# Patient Record
Sex: Male | Born: 1985
Health system: Southern US, Community
[De-identification: ages and names within clinical notes are randomized; demographics above are authoritative.]

## PROBLEM LIST (undated history)

## (undated) DIAGNOSIS — F1210 Cannabis abuse, uncomplicated: Secondary | ICD-10-CM

## (undated) DIAGNOSIS — I219 Acute myocardial infarction, unspecified: Secondary | ICD-10-CM

## (undated) DIAGNOSIS — Z720 Tobacco use: Secondary | ICD-10-CM

## (undated) HISTORY — DX: Tobacco use: Z72.0

## (undated) HISTORY — PX: APPENDECTOMY: SHX54

## (undated) HISTORY — DX: Acute myocardial infarction, unspecified: I21.9

## (undated) HISTORY — DX: Cannabis abuse, uncomplicated: F12.10

---
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
## 2008-11-28 ENCOUNTER — Emergency Department (HOSPITAL_COMMUNITY): Admission: EM | Admit: 2008-11-28 | Discharge: 2008-11-28 | Payer: Self-pay | Admitting: Family Medicine

## 2008-11-28 ENCOUNTER — Emergency Department (HOSPITAL_COMMUNITY): Admission: EM | Admit: 2008-11-28 | Discharge: 2008-11-28 | Payer: Self-pay | Admitting: Emergency Medicine

## 2008-12-01 ENCOUNTER — Emergency Department (HOSPITAL_COMMUNITY): Admission: EM | Admit: 2008-12-01 | Discharge: 2008-12-01 | Payer: Self-pay | Admitting: Family Medicine

## 2008-12-22 ENCOUNTER — Emergency Department (HOSPITAL_COMMUNITY): Admission: EM | Admit: 2008-12-22 | Discharge: 2008-12-22 | Payer: Self-pay | Admitting: Emergency Medicine

## 2010-01-17 ENCOUNTER — Emergency Department (HOSPITAL_COMMUNITY): Admission: EM | Admit: 2010-01-17 | Discharge: 2010-01-17 | Payer: Self-pay | Admitting: Emergency Medicine

## 2010-04-18 ENCOUNTER — Emergency Department (HOSPITAL_COMMUNITY): Admission: EM | Admit: 2010-04-18 | Discharge: 2010-04-18 | Payer: Self-pay | Admitting: Family Medicine

## 2010-06-01 ENCOUNTER — Emergency Department (HOSPITAL_COMMUNITY): Admission: EM | Admit: 2010-06-01 | Discharge: 2010-06-01 | Payer: Self-pay | Admitting: Emergency Medicine

## 2010-07-02 ENCOUNTER — Emergency Department (HOSPITAL_COMMUNITY)
Admission: EM | Admit: 2010-07-02 | Discharge: 2010-07-02 | Disposition: A | Discharge status: Other Acute Inpatient Hospital | Payer: Self-pay | Source: Home / Self Care | Admitting: Family Medicine

## 2010-07-02 ENCOUNTER — Inpatient Hospital Stay (HOSPITAL_COMMUNITY)
Admission: EM | Admit: 2010-07-02 | Discharge: 2010-07-06 | Payer: Self-pay | Source: Home / Self Care | Attending: Cardiology | Admitting: Cardiology

## 2010-07-02 ENCOUNTER — Encounter: Payer: Self-pay | Admitting: Cardiology

## 2010-07-14 ENCOUNTER — Encounter: Payer: Self-pay | Admitting: Cardiology

## 2010-07-22 ENCOUNTER — Ambulatory Visit
Admission: RE | Admit: 2010-07-22 | Discharge: 2010-07-22 | Payer: Self-pay | Source: Home / Self Care | Attending: Cardiology | Admitting: Cardiology

## 2010-07-22 ENCOUNTER — Encounter: Payer: Self-pay | Admitting: Cardiology

## 2010-07-22 DIAGNOSIS — F17200 Nicotine dependence, unspecified, uncomplicated: Secondary | ICD-10-CM | POA: Insufficient documentation

## 2010-07-22 DIAGNOSIS — I214 Non-ST elevation (NSTEMI) myocardial infarction: Secondary | ICD-10-CM | POA: Insufficient documentation

## 2010-07-22 DIAGNOSIS — F411 Generalized anxiety disorder: Secondary | ICD-10-CM | POA: Insufficient documentation

## 2010-07-22 DIAGNOSIS — F1210 Cannabis abuse, uncomplicated: Secondary | ICD-10-CM | POA: Insufficient documentation

## 2010-07-22 LAB — CONVERTED CEMR LAB: LDL Goal: 160 mg/dL

## 2010-08-10 NOTE — Discharge Summary (Signed)
  NAME:  HOLMES, Aumiller                   ACCOUNT NO.:  [id number]  MEDICAL RECORD NO.:  [id number]          PATIENT TYPE:  INP  LOCATION:  2008                         FACILITY:  [facility]  PHYSICIAN:  Noralyn Pick. Eden Emms, MD, FACCDATE OF BIRTH:  Jul 06, 1986  DATE OF ADMISSION:  07/02/2010 DATE OF DISCHARGE:  07/06/2010                              DISCHARGE SUMMARY   ADDENDUM.  Of note, in the absence of obstructive coronary disease with high suspicion for presumed coronary vasospasm, the patient was not discharged on beta-blocker therapy and instead was placed on calcium channel blocker and long-acting nitrate.     Nicolasa Ducking, ANP   ______________________________ Noralyn Pick. Eden Emms, MD, [facility]    CB/MEDQ  D:  07/24/2010  T:  07/25/2010  Job:  [id number]  Electronically Signed by Nicolasa Ducking ANP on 08/10/2010 03:34:56 PM Electronically Signed by Charlton Haws MD [facility] on 08/10/2010 05:25:54 PM

## 2010-08-20 NOTE — Miscellaneous (Signed)
Summary: [facility] Cardiac Physician Order/Treatment Plan   [facility] Physician Order/Treatment Plan   Imported By: Roderic Ovens 07/21/2010 11:52:12  _____________________________________________________________________  External Attachment:    Type:   Image     Comment:   External Document

## 2010-08-20 NOTE — Assessment & Plan Note (Signed)
Summary: eph   Visit Type:  Follow-up Primary Provider:  Kirby Funk, MD  CC:  NQWMI.  History of Present Illness: The patient presents for followup of a recent non-Q wave myocardial infarction. He presented with chest discomfort, enzyme elevations and a very mildly abnormal EKG. However, did cardiac catheterization demonstrated normal coronaries with perhaps a small area of lateral wall motion abnormality. While in the hospital he had recurrent chest discomfort with transient pronounced ST elevation in lateral leads. However, followup CT angiography demonstrated normal coronaries with a zero calcium score. Echocardiography demonstrated a very mild hypokinesis of the distal lateral wall 10 EF of about 55%. MRI suggested the EF to be 52% and did not have any enhancement suggestive of pericarditis or myocarditis. Subsequently he was thought to have coronary spasm and was treated with nitrates and calcium channel blockers. Since going home he said one episode of chest discomfort not as severe as the presenting complaint. He took 2 nitroglycerin and his symptoms resolved. He unfortunately continues to cigarettes though he is bound to a couple a day. He is surrounded by cigarettes at work at home. He smokes marijuana and uses quite a bit of caffeine. He says he's had decreased exercise tolerance. He feels like when he tries to walk that his heart is pounding. He doesn't describe resting tachycardia palpitations. Is not describing PND or orthopnea. He says he does get some increased dyspnea with exertion however. He also alludes to the fact that he is somewhat anxious or depressed though he does not report any suicidal ideation.  Lipid Management History:      Positive NCEP/ATP III risk factors include ASHD (either angina/prior MI/prior CABG).  Negative NCEP/ATP III risk factors include male age less than 33 years old.     Current Medications (verified): 1)  Amlodipine Besylate 2.5 Mg Tabs (Amlodipine  Besylate) .Marland Kitchen.. 1 By Mouth Daily 2)  Aspirin 81 Mg  Tabs (Aspirin) .Marland Kitchen.. 1 By Mouth Daily 3)  Isosorbide Mononitrate Cr 30 Mg Xr24h-Tab (Isosorbide Mononitrate) .Marland Kitchen.. 1 By Mouth Daily 4)  Nitrostat 0.4 Mg Subl (Nitroglycerin) .... As Needed 5)  Protonix 40 Mg Tbec (Pantoprazole Sodium) .Marland Kitchen.. 1 By Mouth Daily  Allergies (verified): No Known Drug Allergies  Past History:  Past Medical History:  1. Non ST-segment elevation myocardial infarction.   2. Ongoing tobacco abuse, currently 1 pack a day.   3. Marijuana abuse, smoking daily.   4. Status post appendectomy at age 82.      Review of Systems       As stated in the HPI and negative for all other systems.   Vital Signs:  Patient profile:   25 year old male Height:      71 inches Weight:      219 pounds BMI:     30.65 Pulse rate:   82 / minute Resp:     16 per minute BP sitting:   108 / 80  (right arm)  Vitals Entered By: Marrion Coy, CNA (July 22, 2010 3:33 PM)  Physical Exam  General:  Well developed, well nourished, in no acute distress. Head:  normocephalic and atraumatic Mouth:  Teeth, gums and palate normal. Oral mucosa normal. Neck:  Neck supple, no JVD. No masses, thyromegaly or abnormal cervical nodes. Chest Wall:  no deformities or breast masses noted Lungs:  Clear bilaterally to auscultation and percussion. Abdomen:  Bowel sounds positive; abdomen soft and non-tender without masses, organomegaly, or hernias noted. No hepatosplenomegaly. Msk:  Back normal,  normal gait. Muscle strength and tone normal. Extremities:  No clubbing or cyanosis. Neurologic:  Alert and oriented x 3. Skin:  Intact without lesions or rashes. Cervical Nodes:  no significant adenopathy Inguinal Nodes:  no significant adenopathy Psych:  Normal affect.   Detailed Cardiovascular Exam  Neck    Carotids: Carotids full and equal bilaterally without bruits.      Neck Veins: Normal, no JVD.    Heart    Inspection: no deformities or  lifts noted.      Palpation: normal PMI with no thrills palpable.      Auscultation: regular rate and rhythm, S1, S2 without murmurs, rubs, gallops, or clicks.    Vascular    Abdominal Aorta: no palpable masses, pulsations, or audible bruits.      Femoral Pulses: normal femoral pulses bilaterally.      Pedal Pulses: normal pedal pulses bilaterally.      Radial Pulses: normal radial pulses bilaterally.      Peripheral Circulation: no clubbing, cyanosis, or edema noted with normal capillary refill.     EKG  Procedure date:  07/22/2010  Findings:      Sinus rhythm, rate 63, axis within normal limits, sinus arrhythmia, no acute ST-T wave changes  Impression & Recommendations:  Problem # 1:  MYOCARDIAL INFARCTION, ACUTE, NON-Q WAVE (ICD-410.70) The patient apparently had coronary vasospasm. He's had no recent chest discomfort similar to this. He should continue on meds as listed. Further testing is not indicated at this point. We discussed at length the need to stop substance abuse such as caffeine, tobacco and marijuana. He couldn't afford the patches. Try to stay with him cigarettes on his own but he says this is hard to secondary smoke he gets. Hein to the emergency room with any future acute symptoms not easily amenable to treatment with nitroglycerin Orders: EKG w/ Interpretation (93000)  Problem # 2:  ANXIETY STATE, UNSPECIFIED (ICD-300.00) The patient alluded to anxiety and depression. I discussed with him the importance of followup on this and encouraged him to make an appointment with his primary physician.  Problem # 3:  TOBACCO ABUSE (ICD-305.1) As above we discussed the need to stop smoking.  Lipid Assessment/Plan:      Based on NCEP/ATP III, the patient's risk factor category is "history of coronary disease, peripheral vascular disease, cerebrovascular disease, or aortic aneurysm".  The patient's lipid goals are as follows: Total cholesterol goal is 200; LDL cholesterol goal  is 160; HDL cholesterol goal is 40; Triglyceride goal is 150.    Patient Instructions: 1)  Your physician recommends that you schedule a follow-up appointment in: 2 month with Dr Antoine Poche 2)  Your physician recommends that you continue on your current medications as directed. Please refer to the Current Medication list given to you today.

## 2010-09-14 ENCOUNTER — Inpatient Hospital Stay (INDEPENDENT_AMBULATORY_CARE_PROVIDER_SITE_OTHER)
Admission: RE | Admit: 2010-09-14 | Discharge: 2010-09-14 | Disposition: A | Discharge status: Home / Self Care | Payer: 59 | Source: Ambulatory Visit | Attending: Family Medicine | Admitting: Family Medicine

## 2010-09-14 DIAGNOSIS — K5289 Other specified noninfective gastroenteritis and colitis: Secondary | ICD-10-CM

## 2010-09-17 ENCOUNTER — Ambulatory Visit: Payer: Self-pay | Admitting: Cardiology

## 2010-09-21 ENCOUNTER — Ambulatory Visit: Payer: Self-pay | Admitting: Cardiology

## 2010-09-28 LAB — HIGH SENSITIVITY CRP: CRP, High Sensitivity: 16.4 mg/L — ABNORMAL HIGH

## 2010-09-28 LAB — LIPID PANEL
HDL: 35 mg/dL — ABNORMAL LOW (ref >39)
Total CHOL/HDL Ratio: 3.2 RATIO
Triglycerides: 87 mg/dL (ref <150)
VLDL: 17 mg/dL (ref 0–40)

## 2010-09-28 LAB — CARDIAC PANEL(CRET KIN+CKTOT+MB+TROPI)
CK, MB: 66.6 ng/mL (ref 0.3–4.0)
CK, MB: 75.4 ng/mL (ref 0.3–4.0)
Relative Index: 8.8 — ABNORMAL HIGH (ref 0.0–2.5)
Total CK: 753 U/L — ABNORMAL HIGH (ref 7–232)
Total CK: 788 U/L — ABNORMAL HIGH (ref 7–232)
Troponin I: 15.98 ng/mL (ref 0.00–0.06)

## 2010-09-28 LAB — CBC
HCT: 38.1 % — ABNORMAL LOW (ref 39.0–52.0)
HCT: 40.3 % (ref 39.0–52.0)
Hemoglobin: 13.4 g/dL (ref 13.0–17.0)
Hemoglobin: 14.2 g/dL (ref 13.0–17.0)
Hemoglobin: 14.5 g/dL (ref 13.0–17.0)
MCH: 30.1 pg (ref 26.0–34.0)
MCH: 30.8 pg (ref 26.0–34.0)
MCH: 31 pg (ref 26.0–34.0)
MCHC: 35.2 g/dL (ref 30.0–36.0)
MCHC: 36 g/dL (ref 30.0–36.0)
MCHC: 36.8 g/dL — ABNORMAL HIGH (ref 30.0–36.0)
MCV: 85.6 fL (ref 78.0–100.0)
MCV: 86.2 fL (ref 78.0–100.0)
Platelets: 142 10*3/uL — ABNORMAL LOW (ref 150–400)
RBC: 4.45 MIL/uL (ref 4.22–5.81)
RDW: 12.5 % (ref 11.5–15.5)
RDW: 12.6 % (ref 11.5–15.5)
RDW: 12.7 % (ref 11.5–15.5)
WBC: 10 10*3/uL (ref 4.0–10.5)

## 2010-09-28 LAB — DIFFERENTIAL
Eosinophils Relative: 3 % (ref 0–5)
Monocytes Absolute: 0.8 10*3/uL (ref 0.1–1.0)
Monocytes Relative: 8 % (ref 3–12)

## 2010-09-28 LAB — RAPID URINE DRUG SCREEN, HOSP PERFORMED
Amphetamines: NOT DETECTED
Cocaine: NOT DETECTED
Opiates: NOT DETECTED
Tetrahydrocannabinol: POSITIVE — AB

## 2010-09-28 LAB — COMPREHENSIVE METABOLIC PANEL
ALT: 37 U/L (ref 0–53)
Chloride: 107 mEq/L (ref 96–112)
GFR calc non Af Amer: >60 mL/min (ref >60)
Glucose, Bld: 105 mg/dL — ABNORMAL HIGH (ref 70–99)
Potassium: 4.3 mEq/L (ref 3.5–5.1)
Total Protein: 6.2 g/dL (ref 6.0–8.3)

## 2010-09-28 LAB — BASIC METABOLIC PANEL
BUN: 13 mg/dL (ref 6–23)
BUN: 14 mg/dL (ref 6–23)
BUN: 8 mg/dL (ref 6–23)
CO2: 25 mEq/L (ref 19–32)
CO2: 27 mEq/L (ref 19–32)
CO2: 28 mEq/L (ref 19–32)
Calcium: 9.1 mg/dL (ref 8.4–10.5)
Chloride: 107 mEq/L (ref 96–112)
Creatinine, Ser: 0.89 mg/dL (ref 0.4–1.5)
GFR calc non Af Amer: >60 mL/min (ref >60)
GFR calc non Af Amer: >60 mL/min (ref >60)
GFR calc non Af Amer: >60 mL/min (ref >60)
Glucose, Bld: 104 mg/dL — ABNORMAL HIGH (ref 70–99)
Glucose, Bld: 110 mg/dL — ABNORMAL HIGH (ref 70–99)
Glucose, Bld: 94 mg/dL (ref 70–99)
Potassium: 3.8 mEq/L (ref 3.5–5.1)
Potassium: 4.2 mEq/L (ref 3.5–5.1)
Sodium: 137 mEq/L (ref 135–145)
Sodium: 137 mEq/L (ref 135–145)
Sodium: 138 mEq/L (ref 135–145)

## 2010-09-28 LAB — SEDIMENTATION RATE: Sed Rate: 7 mm/hr (ref 0–16)

## 2010-09-28 LAB — MRSA PCR SCREENING: MRSA by PCR: NEGATIVE

## 2010-09-28 LAB — POCT CARDIAC MARKERS: Myoglobin, poc: 198 ng/mL (ref 12–200)

## 2010-09-28 LAB — HEMOGLOBIN A1C: Hgb A1c MFr Bld: 4.9 % (ref <5.7)

## 2010-09-28 LAB — PROTIME-INR: INR: 1.14 (ref 0.00–1.49)

## 2010-10-01 LAB — HERPES SIMPLEX VIRUS CULTURE: Culture: NOT DETECTED

## 2010-10-01 LAB — WOUND CULTURE: Culture: NO GROWTH

## 2010-10-27 LAB — COMPREHENSIVE METABOLIC PANEL
ALT: 31 U/L (ref 0–53)
AST: 49 U/L — ABNORMAL HIGH (ref 0–37)
Alkaline Phosphatase: 23 U/L — ABNORMAL LOW (ref 39–117)
CO2: 26 mEq/L (ref 19–32)
Chloride: 104 mEq/L (ref 96–112)
GFR calc Af Amer: >60 mL/min (ref >60)
GFR calc non Af Amer: >60 mL/min (ref >60)
Glucose, Bld: 98 mg/dL (ref 70–99)
Sodium: 137 mEq/L (ref 135–145)
Total Bilirubin: 0.7 mg/dL (ref 0.3–1.2)

## 2010-10-27 LAB — CBC
Hemoglobin: 15 g/dL (ref 13.0–17.0)
RBC: 4.8 MIL/uL (ref 4.22–5.81)
WBC: 11.2 10*3/uL — ABNORMAL HIGH (ref 4.0–10.5)

## 2010-10-27 LAB — DIFFERENTIAL
Basophils Absolute: 0 10*3/uL (ref 0.0–0.1)
Basophils Relative: 0 % (ref 0–1)
Eosinophils Absolute: 0.2 10*3/uL (ref 0.0–0.7)
Eosinophils Relative: 2 % (ref 0–5)

## 2010-10-27 LAB — URINALYSIS, ROUTINE W REFLEX MICROSCOPIC
Bilirubin Urine: NEGATIVE
Glucose, UA: NEGATIVE mg/dL
Nitrite: NEGATIVE
Specific Gravity, Urine: 1.046 — ABNORMAL HIGH (ref 1.005–1.030)
pH: 6 (ref 5.0–8.0)

## 2010-10-27 LAB — RAPID URINE DRUG SCREEN, HOSP PERFORMED
Opiates: NOT DETECTED
Tetrahydrocannabinol: NOT DETECTED

## 2010-10-30 ENCOUNTER — Encounter: Payer: Self-pay | Admitting: Cardiology

## 2010-10-31 ENCOUNTER — Encounter: Payer: Self-pay | Admitting: Cardiology

## 2010-11-03 ENCOUNTER — Encounter: Payer: Self-pay | Admitting: Cardiology

## 2010-11-03 ENCOUNTER — Ambulatory Visit (INDEPENDENT_AMBULATORY_CARE_PROVIDER_SITE_OTHER): Payer: 59 | Admitting: Cardiology

## 2010-11-03 DIAGNOSIS — I214 Non-ST elevation (NSTEMI) myocardial infarction: Secondary | ICD-10-CM

## 2010-11-03 DIAGNOSIS — F17200 Nicotine dependence, unspecified, uncomplicated: Secondary | ICD-10-CM

## 2010-11-03 NOTE — Assessment & Plan Note (Signed)
He understands the implications of his lifestyle choices that include continued tobacco marijuana and alcohol. He understands the risks associated with this. He has been counseled and educated.

## 2010-11-03 NOTE — Progress Notes (Signed)
HPI The patient presents for followup after a hospitalization late last year with a non-Q-wave myocardial infarction. He had catheterization, echo and MRI. The presumptive diagnosis was possible vasospasm. He was discharged on nitrates and calcium channel blockers. However, he stopped this because a chemistry professor told him he should not be taking things. He has had no further symptoms. He says he is very active at work. He does not bring on any chest discomfort, neck or arm discomfort. He does not have any shortness of breath, PND or orthopnea. He has no palpitations, presyncope or syncope. He has had no weight gain or edema. He unfortunately continues to smoke marijuana and cigarettes and drink alcohol by his report.  No Known Allergies  Current Outpatient Prescriptions  Medication Sig Dispense Refill  . nitroGLYCERIN (NITROSTAT) 0.4 MG SL tablet Place 0.4 mg under the tongue every 5 (five) minutes as needed.        Marland Kitchen VYVANSE 30 MG capsule       . DISCONTD: amLODipine (NORVASC) 2.5 MG tablet Take 2.5 mg by mouth daily.        Marland Kitchen DISCONTD: aspirin 81 MG tablet Take 81 mg by mouth daily.        Marland Kitchen DISCONTD: INTUNIV 1 MG TB24       . DISCONTD: isosorbide mononitrate (IMDUR) 30 MG 24 hr tablet Take 30 mg by mouth daily.        Marland Kitchen DISCONTD: ondansetron (ZOFRAN-ODT) 4 MG disintegrating tablet       . DISCONTD: pantoprazole (PROTONIX) 40 MG tablet Take 40 mg by mouth daily.          Past Medical History  Diagnosis Date  . MI (myocardial infarction)   . Marijuana abuse   . Tobacco abuse     Past Surgical History  Procedure Date  . Appendectomy     Age 25   ROS:  As stated in the HPI and negative for all other systems.  PHYSICAL EXAM BP 139/92  Pulse 87  Ht 5\' 11"  (1.803 m)  Wt 204 lb (92.534 kg)  BMI 28.45 kg/m2 GENERAL:  Well appearing NECK:  No jugular venous distention, waveform within normal limits, carotid upstroke brisk and symmetric, no bruits, no thyromegaly LUNGS:  Clear to  auscultation bilaterally BACK:  No CVA tenderness CHEST:  Unremarkable HEART:  PMI not displaced or sustained,S1 and S2 within normal limits, no S3, no S4, no clicks, no rubs, no murmurs ABD:  Flat, positive bowel sounds normal in frequency in pitch, no bruits, no rebound, no guarding, no midline pulsatile mass, no hepatomegaly, no splenomegaly EXT:  2 plus pulses throughout, no edema, no cyanosis no clubbing SKIN:  No rashes no nodules NEURO:  Cranial nerves II through XII grossly intact, motor grossly intact throughout PSYCH:  Cognitively intact, oriented to person place and time, flat affect  EKG: Sinus rhythm, rate 87, axis within normal limits, intervals within normal limits, no acute ST-T wave changes.  ASSESSMENT AND PLAN

## 2010-11-03 NOTE — Assessment & Plan Note (Signed)
I reviewed his hospitalization records. We discussed the small possibility of myocarditis as an etiology versus mass. I did describe to him the risk of recurrent coronary spasm particularly as he has taken himself off of his beta blocker and nitrates. However, he does not want to take medications. He does agree to take nitroglycerin if needed and presented to the emergency room if he ever has further symptoms. He should participate much more aggressively in primary risk reduction and we have talked about this today and a previous encounters.

## 2010-11-03 NOTE — Patient Instructions (Signed)
Follow up as needed only.

## 2010-12-04 NOTE — H&P (Signed)
NAME:  Joel Sandoval                   ACCOUNT NO.:  [id number]   MEDICAL RECORD NO.:  [id number]          PATIENT TYPE:  INP   LOCATION:  0101                         FACILITY:  [facility]   PHYSICIAN:  Anselm Pancoast. Weatherly, M.D.DATE OF BIRTH:  February 25, 1986   DATE OF ADMISSION:  11/14/2004  DATE OF DISCHARGE:                                HISTORY & PHYSICAL   CHIEF COMPLAINT:  Abdominal pain.   HISTORY:  Joel Sandoval is a 25 year old, about 185 pounds male, senior or  junior in high school who presented to the ER after he was seen by urgent  care by Dr. Cleta Alberts.  They called me thinking the patient had acute  appendicitis.  The patient was fine yesterday.  This morning awoke a little  bloated and kind of minimal upset stomach.  Went to work.  He works in [employer], Merchandiser, retail, but came home after about an hour because of pain  that was increasing and evidently shifted to the lower right abdomen.  His  mother took him to urgent care.  He was seen by Dr. Cleta Alberts who did a white  count.  It was 15,500 and on exam he was definitely tender at [place] point  area and I was called thinking this was acute appendicitis.  Recommended I  see him in the emergency room and I was in agreement with the clinical  diagnosis and he was definitely locally tender and not with any flu-like  type symptoms.  I gave him 3 grams of Unasyn and permission obtained for an  open appendectomy.   REVIEW OF SYSTEMS:  No allergies.  No chronic medications.  He has had no  previous surgeries.  Later I find that his grandfather, I had operated on  about 4-5 years ago.   PHYSICAL EXAMINATION:  VITAL SIGNS:  He weighs 186 pounds.  His temperature  was 98.8 here.  He was 100.3 at urgent care.  Pulse is 95.  Blood pressure  is 131/77.  EYES/EARS/NOSE/THROAT:  Negative.  LUNGS:  Clear.  Good breath sounds.  ABDOMEN:  A few bowel sounds but on abdominal exam he is definitely tender  at McBurney point area with muscle guarding and  rebound.  RECTAL:  I did not do a rectal exam __________ at urgent care.  EXTREMITIES:  No pedal edema.  Good peripheral pulses.   ADMISSION IMPRESSION:  Acute appendicitis.   PLAN:  Open appendectomy, general anesthesia, 3 grams of Unasyn will be  administered.      WJW/MEDQ  D:  11/14/2004  T:  11/14/2004  Job:  [id number]

## 2010-12-04 NOTE — Op Note (Signed)
NAME:  Joel Sandoval, Joel Sandoval                   ACCOUNT NO.:  [id number]   MEDICAL RECORD NO.:  [id number]          PATIENT TYPE:  INP   LOCATION:  0101                         F[facility]   PHYSICIAN:  Anselm Pancoast. Weatherly, M.D.DATE OF BIRTH:  [birth year]   DATE OF PROCEDURE:  DATE OF DISCHARGE:                                 OPERATIVE REPORT   PREOPERATIVE DIAGNOSIS:  Acute appendicitis.   HISTORY:  Joel Sandoval is a 25 year old, about 185-190 pounds, Junior, who  presented to the urgent care today with the following history.  He said that  he felt fine yesterday but when he awoke this morning, he was a little  nauseous, kind of upper abdomen did not feel good.  He works at [employer], and he went to work but felt bad.  He did not actually throw up but  came home and then because of the increase in pain which was not shifted to  the right side, kind of lower abdomen, went to the urgent care.  He was seen  by Dr. Cleta Alberts who did a white count that was 15,500 and on exam he was  definitely tender in the right lower quadrant.  Urinalysis was negative.  Dr. Cleta Alberts thought that he probably had acute appendicitis.  He called me and  suggested I see the patient in the ER and I was in agreement.  He was  definitely tender at McBurney point, pretty well localized, temperature of  100.3, white count was 15,500 with a left shift, and I thought that this was  obviously clinically acute appendicitis and recommended proceeding with  appendectomy without CT.  The patient was in agreement and was taken to the  OR, after he was given 3 grams of Unasyn and permission obtained.   OPERATION:  Open appendectomy.   ANESTHESIA:  General.   SURGEON:  Anselm Pancoast. Zachery Dakins, M.D.   Endotracheal tube.  Abdomen was shaved in the right lower quadrant, and then  prepped with Betadine scrub solution and draped in a sterile manner.  In the  incision area I elected to make kind of transverse incision because of his  chubbiness.  Sharp segments of the skin and subcutaneous tissue and external  oblique was splint in the direction of its fibers.  The underlying internal  oblique was splint in the direction of its fibers exposing the lateral edge  of the rectus and then the peritoneum was identified under the  transversalis.  I carefully picked up the peritoneum, open into the  peritoneal cavity there was a moderate amount of fluid but it did not look  frankly grossly infected.  The two appendiceal tracts were used.  I could  run my finger laterally and the appendix was located right at the most  lateral aspect of the cecum.  It was acutely inflamed and I could grasp it  and pull it up into the wound.  The cecum itself was difficult to kind of  mobilize up into the incision and we divided the appendiceal mesentery  between right angles and Kelly's, and these  ligated with 2-0 Vicryl down to  the base of the inflamed appendix.  The appendix itself was crushed at its  base, a double tie of 2-0 Vicryl then a purse string of 3-0 silk placed.  The appendix removed and stump everted.  I put a second a second Z-stitch  for closure.  Good hemostasis had been obtained and the cecum was in its  normal position.  The omentum was coming over to the area and then we closed  the peritoneum and transversalis. with a running 2-0 Vicryl.  The internal  oblique was closed using interrupted 2-0 Vicryl.  Then the external oblique  was closed with a running 2-0 Vicryl.  The appearance of Scarpa's was  accomplished with interrupted closed with 4-0 Vicryl, 4-0 Vicryl  subcuticular and there are only Steri-Strips on the skin.  The patient  tolerated the procedure nicely and was sent to the recovery room in stable  post-op condition.  We will give him  liquids and hopefully he will be ready for discharge tomorrow.  He is quite  muscular and hopefully will not have too much as far as nausea or vomiting  since it was not much  intestinal manipulation but a lot of pulling by the  scrub nurse for exposure.      WJW/MEDQ  D:  11/14/2004  T:  11/14/2004  Job:  [id number]

## 2010-12-04 NOTE — H&P (Signed)
NAME:  Joel Sandoval, Joel Sandoval                   ACCOUNT NO.:  [id number]   MEDICAL RECORD NO.:  [id number]          PATIENT TYPE:  INP   LOCATION:  0101                         FACILITY:  [facility]   PHYSICIAN:  Anselm Pancoast. Weatherly, M.D.DATE OF BIRTH:  [birth year]   DATE OF ADMISSION:  11/14/2004  DATE OF DISCHARGE:                                HISTORY & PHYSICAL   Audio too short to transcribe (less than 5 seconds)      WJW/MEDQ  D:  11/14/2004  T:  11/14/2004  Job:  [id number]

## 2011-02-26 ENCOUNTER — Emergency Department (HOSPITAL_COMMUNITY): Payer: 59

## 2011-02-26 ENCOUNTER — Emergency Department (HOSPITAL_COMMUNITY)
Admission: EM | Admit: 2011-02-26 | Discharge: 2011-02-26 | Disposition: A | Discharge status: Home / Self Care | Payer: 59 | Attending: Emergency Medicine | Admitting: Emergency Medicine

## 2011-02-26 DIAGNOSIS — F17200 Nicotine dependence, unspecified, uncomplicated: Secondary | ICD-10-CM | POA: Insufficient documentation

## 2011-02-26 DIAGNOSIS — I252 Old myocardial infarction: Secondary | ICD-10-CM | POA: Insufficient documentation

## 2011-02-26 DIAGNOSIS — F1210 Cannabis abuse, uncomplicated: Secondary | ICD-10-CM | POA: Insufficient documentation

## 2011-02-26 DIAGNOSIS — R079 Chest pain, unspecified: Secondary | ICD-10-CM

## 2011-02-26 DIAGNOSIS — R0789 Other chest pain: Secondary | ICD-10-CM | POA: Insufficient documentation

## 2011-02-26 LAB — POCT I-STAT TROPONIN I
Troponin i, poc: 0 ng/mL (ref 0.00–0.08)
Troponin i, poc: 0 ng/mL (ref 0.00–0.08)
Troponin i, poc: 0 ng/mL (ref 0.00–0.08)

## 2011-02-26 LAB — COMPREHENSIVE METABOLIC PANEL
Albumin: 4.5 g/dL (ref 3.5–5.2)
BUN: 13 mg/dL (ref 6–23)
Calcium: 9.5 mg/dL (ref 8.4–10.5)
Chloride: 99 mEq/L (ref 96–112)
Creatinine, Ser: 0.95 mg/dL (ref 0.50–1.35)
Total Bilirubin: 0.5 mg/dL (ref 0.3–1.2)

## 2011-02-26 LAB — DIFFERENTIAL
Basophils Absolute: 0.1 10*3/uL (ref 0.0–0.1)
Basophils Relative: 0 % (ref 0–1)
Eosinophils Relative: 4 % (ref 0–5)
Monocytes Absolute: 0.7 10*3/uL (ref 0.1–1.0)
Neutro Abs: 5.3 10*3/uL (ref 1.7–7.7)

## 2011-02-26 LAB — CBC
Hemoglobin: 15.9 g/dL (ref 13.0–17.0)
MCHC: 36.7 g/dL — ABNORMAL HIGH (ref 30.0–36.0)
RDW: 12.6 % (ref 11.5–15.5)

## 2011-02-26 LAB — CK TOTAL AND CKMB (NOT AT ARMC)
CK, MB: 1.8 ng/mL (ref 0.3–4.0)
Total CK: 69 U/L (ref 7–232)

## 2011-03-02 NOTE — Consult Note (Signed)
NAME:  Joel Sandoval, Joel Sandoval                   ACCOUNT NO.:  [id number]  MEDICAL RECORD NO.:  [id number]  LOCATION:  WLED                         FACILITY:  [facility]  PHYSICIAN:  Veverly Fells. Excell Seltzer, MD  DATE OF BIRTH:  1985-10-25  DATE OF CONSULTATION: DATE OF DISCHARGE:  02/26/2011                                CONSULTATION   CHIEF COMPLAINT:  Chest pain.  HISTORY OF PRESENT ILLNESS:  Joel Sandoval is a 25 year old gentleman who presents with substernal chest pain.  He woke up this morning and after awakening, he developed a sudden onset of left-sided substernal chest discomfort.  The symptom onset was abrupt and he described severe pain. There were no associated symptoms.  He specifically denied dyspnea, nausea, vomiting, or diaphoresis.  He had similar symptoms back in December 2011, when he was noted to have an acute myocardial infarction. At the time of his cardiac catheterization, there was no significant coronary disease noted.  His EKG; however, was consistent with an inferior MI and coronary vasospasm was the presumed mechanism.  The patient had an extensive evaluation including an echocardiogram, cardiac catheterization, CT angiogram of the chest, and cardiac MRI.  There were no significant abnormalities on any of these studies with the cardiac MRI showing no hyperenhancement or scar tissue.  The patient's ejection fraction was normal by all studies ranging from 52% on cardiac MRI to 60% on cardiac cath.  He was discharged on a combination of aspirin, Norvasc, and Imdur.  He has been off of all these medications now for several months.  He unfortunately continues to smoke cigarettes and marijuana on a daily basis.  He currently is chest pain free and has had no residual problems since his pain has resolved this morning.  He described the pain is both a pressure-like sensation and a sharp pain.  It was relieved with 3 sublingual nitroglycerin.  CURRENT MEDICATIONS:  None.  ALLERGIES:   NKDA.  PAST MEDICAL HISTORY: 1. Coronary vasospasm as outlined above. 2. Tobacco abuse. 3. Marijuana abuse. 4. Alcohol abuse. 5. Remote appendectomy.  SOCIAL HISTORY:  The patient is single.  He lives locally in [place]. 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  FAMILY HISTORY:  Mother had a pacemaker placed at age 77.  Father is alive and well.  There is no premature coronary disease in the family.  REVIEW OF SYSTEMS:  Negative except as per HPI.  PHYSICAL EXAMINATION:  GENERAL:  The patient is alert and oriented.  He is in no distress. VITAL SIGNS:  His heart rate 72 beats per minute, blood pressure is 128/78, respiratory rate 16, oxygen saturation 100% on 2 liters.  He is age appropriate. HEENT:  Normal. NECK:  Normal carotid upstrokes.  No bruits.  JVP normal. LUNGS: Clear bilaterally. HEART: Regular rate and rhythm.  The apex is discrete, nondisplaced. There are no murmurs or gallops. ABDOMEN: Soft and nontender.  No organomegaly. BACK:  No CVA tenderness. EXTREMITIES:  No clubbing, cyanosis, or edema.  Peripheral pulses are intact and equal. SKIN:  Warm and dry without rash. NEUROLOGIC:  Cranial nerves II-XII are intact.  Strength is intact and equal bilaterally.  LAB WORK:  Shows  serial troponin's are all normal with most recent troponin 0.0.  CK-MB are also normal 68 and 1.7.  CBC shows a white blood cell count of 11.2, hemoglobin 15.9, and platelet count 187,000. Potassium is 3.6 and creatinine is 0.95.  LFTs are within normal limits.  EKG shows normal sinus rhythm and is within normal limits.  The heart rate was 72 beats per minute.  Chest x-ray shows no acute cardiopulmonary findings.  FINAL ASSESSMENT: 1. Chest pain, probable transient coronary vasospasm.  The patient's     symptoms are similar to his previous presentation,  albeit this     episode is more transient in nature.  His serial cardiac markers     are negative and his EKG shows no abnormality.  I do not  think he     needs further workup at this time.  He just had a cardiac     catheterization within the past year that showed normal findings.     I have recommended that he resume aspirin 81 mg daily.  He also     will be started on long-acting diltiazem CD 120 mg daily.  The     patient will be followed closely in the office with a followup     appointment with Tereso Newcomer within the next 2 weeks and ongoing     followup with Dr. Antoine Poche. 2. Tobacco and marijuana abuse.  I had a long discussion with the     patient and he understands that this is an important part of the     mechanism of coronary vasospasm.  I advised him that he will     continue to have recurrent problems if he continues to smoke and he     understands this.  Extensive counseling was done.  With no objective evidence of ischemia and the patient now chest pain free, I think he can be safely discharged home with followup as outlined above.  Again, he will start on aspirin 81 mg daily and diltiazem CD 120 mg daily.     Veverly Fells. Excell Seltzer, MD     MDC/MEDQ  D:  02/26/2011  T:  02/27/2011  Job:  [id number]  cc:   Rollene Rotunda, MD, [facility] [address]  [address] [place] [address]  Tereso Newcomer, PA-C  Electronically Signed by Tonny Bollman MD on 03/02/2011 05:27:07 AM

## 2011-08-25 ENCOUNTER — Emergency Department (HOSPITAL_COMMUNITY)
Admission: EM | Admit: 2011-08-25 | Discharge: 2011-08-25 | Disposition: A | Discharge status: Home / Self Care | Payer: 59 | Attending: Emergency Medicine | Admitting: Emergency Medicine

## 2011-08-25 ENCOUNTER — Encounter (HOSPITAL_COMMUNITY): Payer: Self-pay | Admitting: Emergency Medicine

## 2011-08-25 DIAGNOSIS — IMO0002 Reserved for concepts with insufficient information to code with codable children: Secondary | ICD-10-CM | POA: Insufficient documentation

## 2011-08-25 DIAGNOSIS — S058X9A Other injuries of unspecified eye and orbit, initial encounter: Secondary | ICD-10-CM | POA: Insufficient documentation

## 2011-08-25 DIAGNOSIS — F17200 Nicotine dependence, unspecified, uncomplicated: Secondary | ICD-10-CM | POA: Insufficient documentation

## 2011-08-25 DIAGNOSIS — I252 Old myocardial infarction: Secondary | ICD-10-CM | POA: Insufficient documentation

## 2011-08-25 DIAGNOSIS — Y93B9 Activity, other involving muscle strengthening exercises: Secondary | ICD-10-CM | POA: Insufficient documentation

## 2011-08-25 DIAGNOSIS — H11419 Vascular abnormalities of conjunctiva, unspecified eye: Secondary | ICD-10-CM | POA: Insufficient documentation

## 2011-08-25 DIAGNOSIS — H538 Other visual disturbances: Secondary | ICD-10-CM | POA: Insufficient documentation

## 2011-08-25 DIAGNOSIS — Z79899 Other long term (current) drug therapy: Secondary | ICD-10-CM | POA: Insufficient documentation

## 2011-08-25 DIAGNOSIS — S0501XA Injury of conjunctiva and corneal abrasion without foreign body, right eye, initial encounter: Secondary | ICD-10-CM

## 2011-08-25 DIAGNOSIS — H5710 Ocular pain, unspecified eye: Secondary | ICD-10-CM | POA: Insufficient documentation

## 2011-08-25 MED ORDER — PROPARACAINE HCL 0.5 % OP SOLN
1.0000 [drp] | Freq: Once | OPHTHALMIC | Status: AC
  Administered 2011-08-25: 1 [drp] via OPHTHALMIC
  Filled 2011-08-25: qty 15

## 2011-08-25 MED ORDER — FLUORESCEIN SODIUM 1 MG OP STRP
1.0000 | ORAL_STRIP | Freq: Once | OPHTHALMIC | Status: AC
  Administered 2011-08-25: 1 via OPHTHALMIC
  Filled 2011-08-25: qty 1

## 2011-08-25 MED ORDER — POLYMYXIN B-TRIMETHOPRIM 10000-0.1 UNIT/ML-% OP SOLN
1.0000 [drp] | OPHTHALMIC | Status: DC
  Administered 2011-08-25: 1 [drp] via OPHTHALMIC
  Filled 2011-08-25 (×2): qty 10

## 2011-08-25 NOTE — ED Notes (Signed)
Pt states he was working out with a stretchy bungy type of equipment and it snapped back and hit him in the eyes

## 2011-08-25 NOTE — ED Provider Notes (Addendum)
History     CSN: [id number]  Arrival date & time 08/25/11  1918   First MD Initiated Contact with Patient 08/25/11 1922      Chief Complaint  Patient presents with  . Eye Injury    (Consider location/radiation/quality/duration/timing/severity/associated sxs/prior treatment) The history is provided by the patient. No language interpreter was used.    26yo male presents c/o eye injury.  Pt sts he was using an elastic band to exercise when he accidentally released it and the recoil hits both of his eyes.  Complaining of pain to both eyes, and teary eyes.  Pain is sharp and burning, with mild blurry vision.  Denies any other injuries.  Denies pressure behind eyes or double vision.  Does not wear prescription eye glasses or contact lens.  Past Medical History  Diagnosis Date  . MI (myocardial infarction)   . Marijuana abuse   . Tobacco abuse     Past Surgical History  Procedure Date  . Appendectomy     Age 26    No family history on file.  History  Substance Use Topics  . Smoking status: Current Everyday Smoker -- 1.0 packs/day for 6 years    Types: Cigarettes  . Smokeless tobacco: Never Used  . Alcohol Use: 1.2 - 3.6 oz/week    2-6 Cans of beer per week     5 days a week      Review of Systems  All other systems reviewed and are negative.    Allergies  Review of patient's allergies indicates no known allergies.  Home Medications   Current Outpatient Rx  Name Route Sig Dispense Refill  . NITROGLYCERIN 0.4 MG SL SUBL Sublingual Place 0.4 mg under the tongue every 5 (five) minutes as needed.      Marland Kitchen VYVANSE 30 MG PO CAPS        There were no vitals taken for this visit.  Physical Exam  Constitutional: He is oriented to person, place, and time. He appears well-developed and well-nourished. No distress.  HENT:  Head: Normocephalic and atraumatic.  Nose: Nose normal.  Mouth/Throat: Oropharynx is clear and moist and mucous membranes are normal.  Eyes: EOM and  lids are normal. Pupils are equal, round, and reactive to light. No foreign bodies found. Right eye exhibits no chemosis and no discharge. No foreign body present in the right eye. Left eye exhibits no chemosis, no discharge and no exudate. No foreign body present in the left eye. Right conjunctiva is injected. Right conjunctiva has no hemorrhage. Left conjunctiva is injected. Left conjunctiva has no hemorrhage. No scleral icterus. Right eye exhibits normal extraocular motion and no nystagmus. Left eye exhibits normal extraocular motion and no nystagmus.  Fundoscopic exam:      The right eye shows no hemorrhage.       The left eye shows no hemorrhage.  Slit lamp exam:      The right eye shows corneal abrasion and fluorescein uptake. The right eye shows no corneal flare, no corneal ulcer, no foreign body, no hyphema and no anterior chamber bulge.       The left eye shows corneal abrasion and fluorescein uptake. The left eye shows no corneal flare, no corneal ulcer, no foreign body, no hyphema and no anterior chamber bulge.  Musculoskeletal: Normal range of motion.  Neurological: He is alert and oriented to person, place, and time.    ED Course  Procedures (including critical care time)  Labs Reviewed - No data to display  No results found.   No diagnosis found.    MDM  Corneal abrasion to both eyes (verified by woods lamp) 2/2 eye injury with elastic band.  No evidence of hyphema.  Pain improves significantly with proparacaine eye drops.  Polytrim given.  Work note given.  F/u instruction given.  Visual acuity 20/40 to R eye, and 20/20 to L eye        Fayrene Helper, PA-C 08/25/11 2012  Fayrene Helper, PA-C 08/25/11 2013  Fayrene Helper, PA-C 08/25/11 2018

## 2011-08-25 NOTE — ED Provider Notes (Signed)
Medical screening examination/treatment/procedure(s) were performed by non-physician practitioner and as supervising physician I was immediately available for consultation/collaboration.   Andrew King, MD 08/25/11 2017 

## 2012-02-14 IMAGING — CT CT HEART MORP W/ CTA COR W/ SCORE W/ CA W/CM &/OR W/O CM
1 of 5 series · 11 of 20 positions shown, 14 images · non-contrast
Comparison: CT of 11/28/2008.

OVER-READ INTERPRETATION - CT CHEST

The following report is an over-read performed by radiologist Dr.
[DATE].  This over-read does not include interpretation of
cardiac or coronary anatomy or pathology.  The CTA interpretation
by the cardiologist is attached.
INDICATION: ? Coronary spasm.  24 yo with chest pain, anterolateral
ST elevation and no epicardial CAD on catheterization.  R/O
anomaly, disection or medial hematoma.
PROTOCOL: The patient was scanned on a [employer] 256 scanner.
Collimation was .9mm and rotation speed 555msec.  A prospectively
gated study was done with 5% phase tolerance.  No beta blocker was
used due to ? of spasm.  Average heart rate during scan was 60 bpm.
Patient was on iv nitro and additional subliqual nitro was given
x1.  80cc of contrast was used with the angiogram triggered using
bolus tracking in the descending aorta.  The 3D data set was
reviewed on a [HOSPITAL] and [employer] work station.

[Series 7: w/ edge cor., 78.0% · axial · 0.49mm/px · z∈[-216,-112]mm · 11 of 276 slices shown, 14 images]
[im 23/276  vessel]
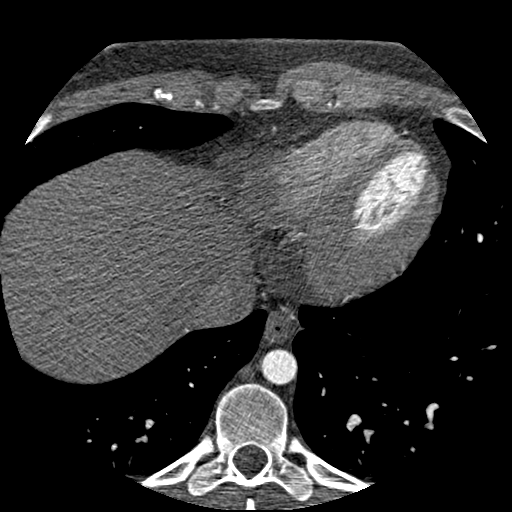
[im 23/276  lung]
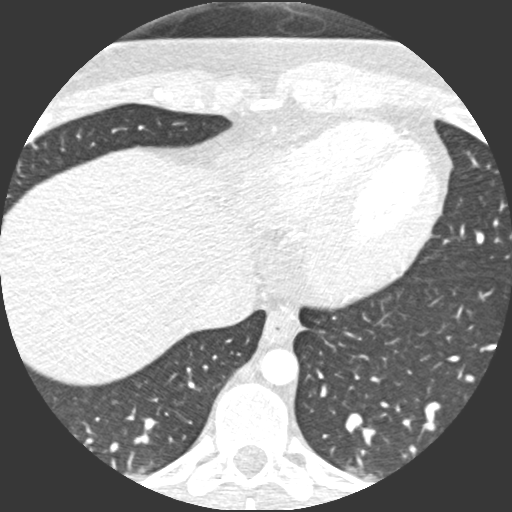
[im 46/276  vessel]
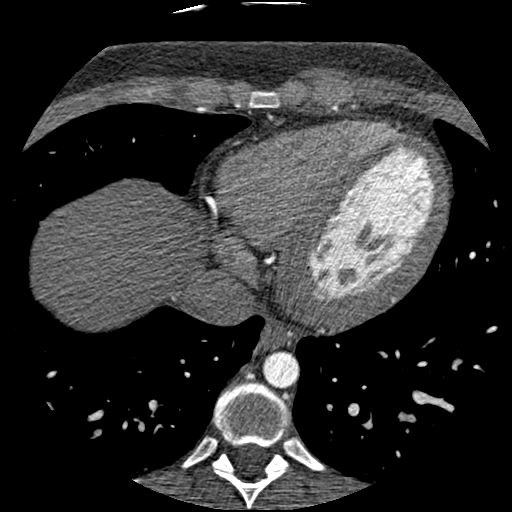
[im 69/276  vessel]
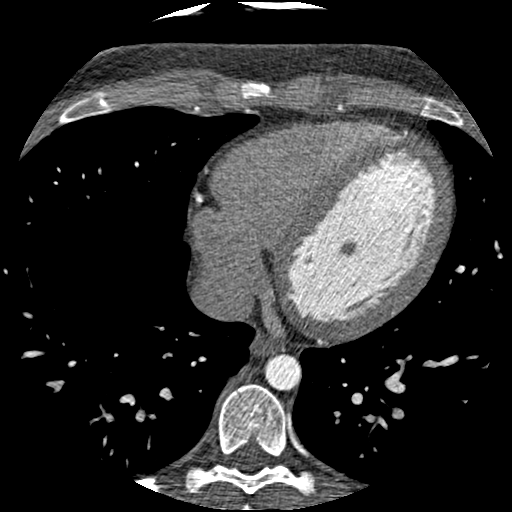
[im 92/276  vessel]
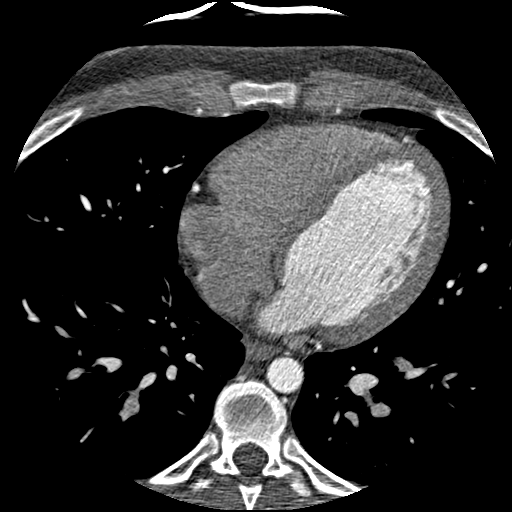
[im 115/276  vessel]
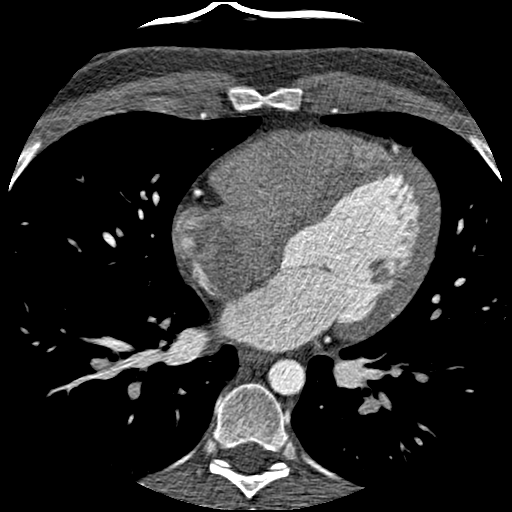
[im 115/276  lung]
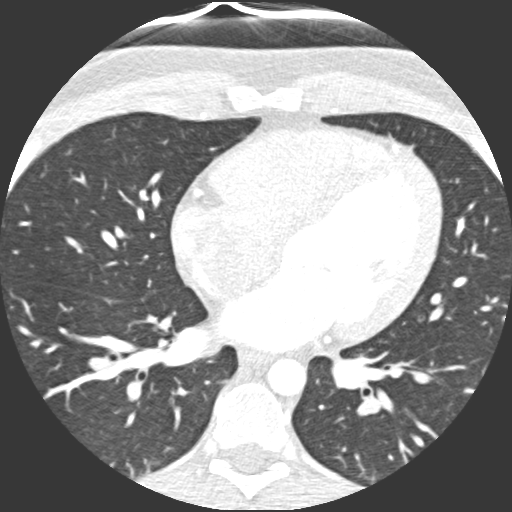
[im 138/276  vessel]
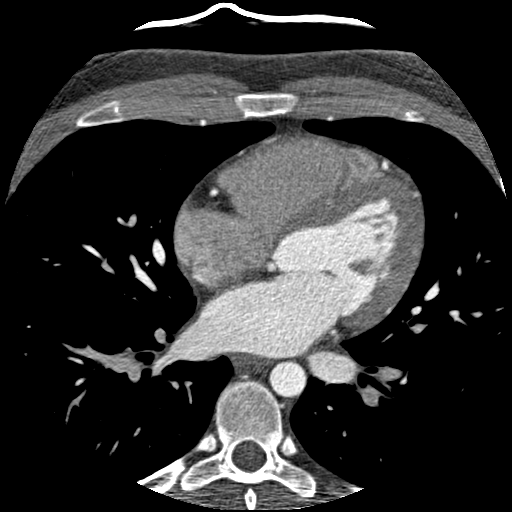
[im 161/276  vessel]
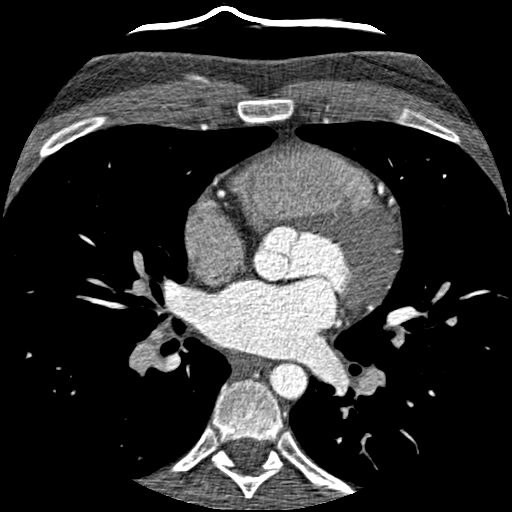
[im 184/276  vessel]
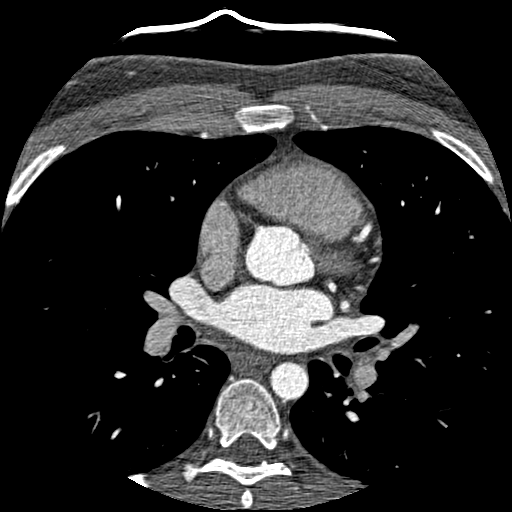
[im 207/276  vessel]
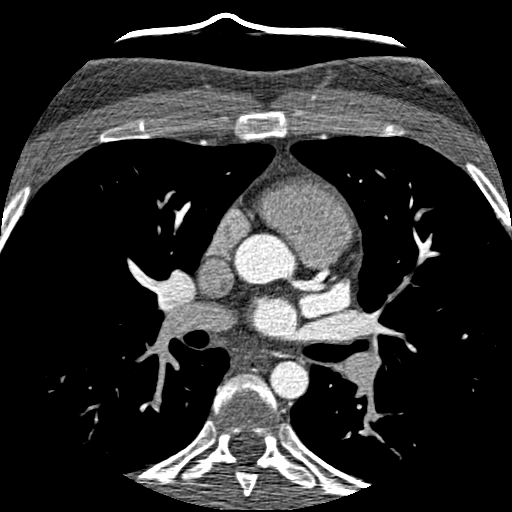
[im 207/276  lung]
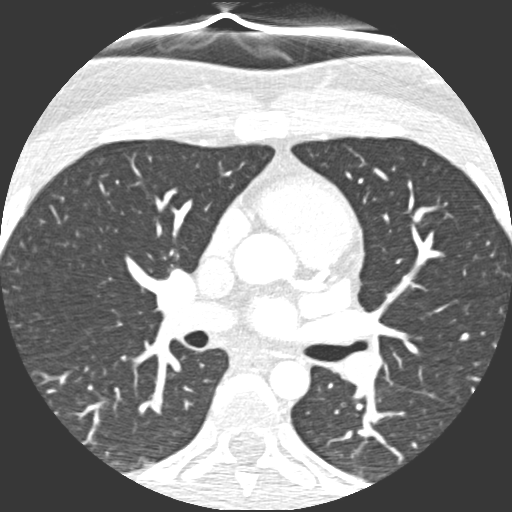
[im 230/276  vessel]
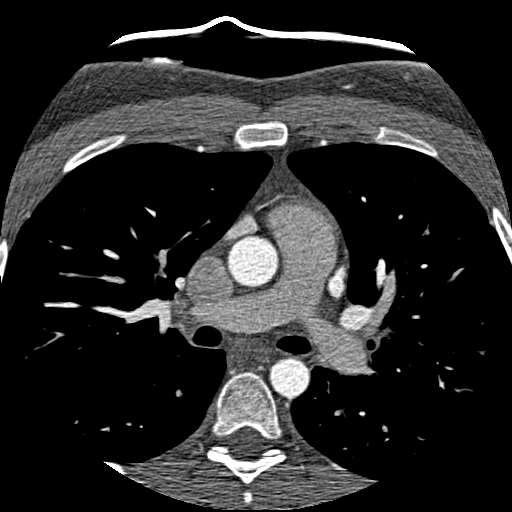
[im 253/276  vessel]
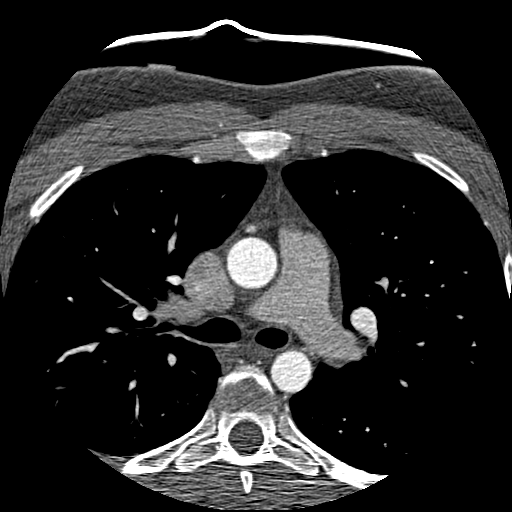

[11 of 20 positions shown; findings below may reference images not displayed]

FINDINGS: Lung windows demonstrate no nodules or airspace
opacities.

Soft tissue windows demonstrate normal caliber of the imaged
thoracic aorta without evidence of dissection.  No pericardial or
pleural effusion. No mediastinal or hilar adenopathy.  No central
pulmonary embolism.

Limited abdominal imaging demonstrates no significant findings.
IMPRESSION: No significant extracardiac findings.

Cardiac CT:
FINDINGS: Coronary Arteries:  No anomalies.  Right dominant.  LM normal, LAD
normal, D1,D2,D3 all small and normal.  Circumflex normal.  Small
OM1, normal, Large OM2 and OM3 normal.  RCA dominant and normal.
No evidence of soft plaque, calcium intimal disection or medial
hematoma.

Calcium Score: 0

No significant non cardiac findings on review of soft tissue and
lung windows.  See separate report from Dr Erzinger [HOSPITAL]
IMPRESSION: 1)    Normal right dominant coronary arteries with no anomalies

2)    Calcium score 0
3)    No significant non-cardiac findings
The case was discussed with Dr Durmaz.  Recommend MRI in 1-2
weeks to see if there is hyperenhancement in a coronary
distribution to confirm spasm or patchy uptake to suggest
myocarditis.

Patient tolerated the scan well.

## 2013-04-28 ENCOUNTER — Emergency Department (INDEPENDENT_AMBULATORY_CARE_PROVIDER_SITE_OTHER): Payer: 59

## 2013-04-28 ENCOUNTER — Emergency Department (HOSPITAL_COMMUNITY)
Admission: EM | Admit: 2013-04-28 | Discharge: 2013-04-28 | Disposition: A | Discharge status: Home / Self Care | Payer: 59 | Source: Home / Self Care

## 2013-04-28 ENCOUNTER — Encounter (HOSPITAL_COMMUNITY): Payer: Self-pay | Admitting: Emergency Medicine

## 2013-04-28 DIAGNOSIS — R1011 Right upper quadrant pain: Secondary | ICD-10-CM

## 2013-04-28 DIAGNOSIS — R1013 Epigastric pain: Secondary | ICD-10-CM

## 2013-04-28 DIAGNOSIS — R52 Pain, unspecified: Secondary | ICD-10-CM

## 2013-04-28 DIAGNOSIS — R1012 Left upper quadrant pain: Secondary | ICD-10-CM

## 2013-04-28 LAB — POCT URINALYSIS DIP (DEVICE)
Bilirubin Urine: NEGATIVE
Glucose, UA: NEGATIVE mg/dL
Ketones, ur: NEGATIVE mg/dL
Leukocytes, UA: NEGATIVE

## 2013-04-28 LAB — POCT I-STAT, CHEM 8
BUN: 8 mg/dL (ref 6–23)
Chloride: 100 mEq/L (ref 96–112)
Potassium: 4.6 mEq/L (ref 3.5–5.1)
Sodium: 138 mEq/L (ref 135–145)

## 2013-04-28 MED ORDER — GI COCKTAIL ~~LOC~~
30.0000 mL | Freq: Once | ORAL | Status: AC
  Administered 2013-04-28: 30 mL via ORAL

## 2013-04-28 MED ORDER — GI COCKTAIL ~~LOC~~
ORAL | Status: AC
  Filled 2013-04-28: qty 30

## 2013-04-28 MED ORDER — PANTOPRAZOLE SODIUM 40 MG PO TBEC: 40.0000 mg | DELAYED_RELEASE_TABLET | Freq: Every day | ORAL | Status: DC

## 2013-04-28 NOTE — ED Notes (Signed)
Pt c/o epigastric abd pain onset 1 week Sxs include: diarrhea (1 loose stool today), nauseas Report he just got over a cold... Given Amox; still taking and tolerating well Also taking Zantac and OTC acid reflux meds w/no relief Denies: Fevers, vomitig... Decreased appetite but is drinking well.  Recently got back from [place] on 10/8 Alert w/no signs of acute distress.

## 2013-04-28 NOTE — ED Provider Notes (Signed)
CSN: [id number]     Arrival date & time 04/28/13  1033 History   First MD Initiated Contact with Patient 04/28/13 1231     Chief Complaint  Patient presents with  . Abdominal Pain   (Consider location/radiation/quality/duration/timing/severity/associated sxs/prior Treatment) HPI Comments: 27 year old male is complaining of a constant epigastric pain for just a little over a week. He went on a trip to [place] in the first week of September he returned on August 4. About halfway to the trip he developed nasal congestion, upper respiratory congestion, PND and sore throat. After returning to denies states on October 4 he saw his physician and was given amoxicillin for strep throat. Patient states that a strep throat test was actually negative. The epigastric pain is constant, after eating and at other times he will have more severe sharp pain. Also complaining of mild diarrhea for approximately one week. The upper respiratory symptoms and sore throat had since abated. His primary complaint today is that of the abdominal pain.  Patient is a 27 y.o. male presenting with abdominal pain.  Abdominal Pain Associated symptoms include abdominal pain. Pertinent negatives include no chest pain.    Past Medical History  Diagnosis Date  . MI (myocardial infarction)   . Marijuana abuse   . Tobacco abuse    Past Surgical History  Procedure Laterality Date  . Appendectomy      Age 43   Family History  Problem Relation Age of Onset  . Coronary artery disease Mother    History  Substance Use Topics  . Smoking status: Current Every Day Smoker -- 0.50 packs/day for 6 years    Types: Cigarettes  . Smokeless tobacco: Never Used  . Alcohol Use: 1.2 - 3.6 oz/week    2-6 Cans of beer per week     Comment: socially    Review of Systems  Constitutional: Positive for activity change, appetite change and unexpected weight change. Negative for fever and chills.  HENT: Negative.   Eyes: Negative.    Respiratory: Negative.   Cardiovascular: Negative for chest pain.  Gastrointestinal: Positive for nausea, abdominal pain, diarrhea and constipation. Negative for vomiting and abdominal distention.  Genitourinary: Negative.   Musculoskeletal: Negative.   Neurological: Negative.     Allergies  Review of patient's allergies indicates no known allergies.  Home Medications   Current Outpatient Rx  Name  Route  Sig  Dispense  Refill  . lisdexamfetamine (VYVANSE) 40 MG capsule   Oral   Take 40 mg by mouth daily.         . nitroGLYCERIN (NITROSTAT) 0.4 MG SL tablet   Sublingual   Place 0.4 mg under the tongue every 5 (five) minutes as needed. For chest pain.         . pantoprazole (PROTONIX) 40 MG tablet   Oral   Take 1 tablet (40 mg total) by mouth daily.   20 tablet   0    BP 131/75  Pulse 83  Temp(Src) 98.1 F (36.7 C) (Oral)  Resp 17  SpO2 98% Physical Exam  Nursing note and vitals reviewed. Constitutional: He is oriented to person, place, and time. He appears well-developed and well-nourished. No distress.  HENT:  Right Ear: External ear normal.  Left Ear: External ear normal.  Oral pharynx with minor erythema and moderate amount of clear frothy PND. No exudates  Eyes: Conjunctivae and EOM are normal.  Neck: Normal range of motion. Neck supple.  Cardiovascular: Normal rate, regular rhythm and normal heart  sounds.   Pulmonary/Chest: Effort normal and breath sounds normal. No respiratory distress. He has no wheezes. He has no rales.  Abdominal: Soft. Bowel sounds are normal. He exhibits no distension and no mass. There is no rebound and no guarding.  Minor tenderness to deep palpation in the epigastrium. No signs of acute abdomen or peritoneal signs.   Musculoskeletal: He exhibits no edema and no tenderness.  Lymphadenopathy:    He has no cervical adenopathy.  Neurological: He is alert and oriented to person, place, and time. He exhibits normal muscle tone.   Skin: Skin is warm and dry.  Psychiatric: He has a normal mood and affect.    ED Course  Procedures (including critical care time) Labs Review Labs Reviewed  POCT URINALYSIS DIP (DEVICE)  POCT I-STAT, CHEM 8   Imaging Review Dg Abd 1 View  04/28/2013   CLINICAL DATA:  27 year old male with abdominal pain, nausea and diarrhea.  EXAM: ABDOMEN - 1 VIEW  COMPARISON:  11/28/2008  FINDINGS: The bowel gas pattern is unremarkable. There is no evidence of dilated bowel loops, bowel obstruction or pneumoperitoneum.  No suspicious calcifications are identified.  No acute or suspicious bony abnormalities are present.  IMPRESSION: Negative.   Electronically Signed   By: Laveda Abbe M.D.   On: 04/28/2013 13:26   Results for orders placed during the hospital encounter of 04/28/13  POCT URINALYSIS DIP (DEVICE)      Result Value Range   Glucose, UA NEGATIVE  NEGATIVE mg/dL   Bilirubin Urine NEGATIVE  NEGATIVE   Ketones, ur NEGATIVE  NEGATIVE mg/dL   Specific Gravity, Urine >=1.030  1.005 - 1.030   Hgb urine dipstick NEGATIVE  NEGATIVE   pH 7.0  5.0 - 8.0   Protein, ur NEGATIVE  NEGATIVE mg/dL   Urobilinogen, UA 0.2  0.0 - 1.0 mg/dL   Nitrite NEGATIVE  NEGATIVE   Leukocytes, UA NEGATIVE  NEGATIVE  POCT I-STAT, CHEM 8      Result Value Range   Sodium 138  135 - 145 mEq/L   Potassium 4.6  3.5 - 5.1 mEq/L   Chloride 100  96 - 112 mEq/L   BUN 8  6 - 23 mg/dL   Creatinine, Ser 1.61  0.50 - 1.35 mg/dL   Glucose, Bld 96  70 - 99 mg/dL   Calcium, Ion 0.96  0.45 - 1.23 mmol/L   TCO2 29  0 - 100 mmol/L   Hemoglobin 15.6  13.0 - 17.0 g/dL   HCT 40.9  81.1 - 91.4 %        MDM   1. Abdominal pain, bilateral upper quadrant   2. Abdominal pain, acute, epigastric      X-ray of abd reveals stool and gas across the upper ascending, transverse and descending colon. No signs of obstruction. This may be a cause of cramping as he thought he was constipated a few weeks earlier.  More like gastritis   But no relief with H2 or antacids.  Rx Protonix 40 mg q d. May try Miralax For worsening pain, vomiting, or other symptoms go to the ED, otherwise call your MD for f/U appt, may need to see a GI  Hayden Rasmussen, NP 04/28/13 1459

## 2013-04-29 NOTE — ED Provider Notes (Signed)
Medical screening examination/treatment/procedure(s) were performed by resident physician or non-physician practitioner and as supervising physician I was immediately available for consultation/collaboration.   Aaryanna Hyden DOUGLAS MD.   Fynley Chrystal D Leahanna Buser, MD 04/29/13 1239 

## 2013-06-06 ENCOUNTER — Emergency Department (HOSPITAL_COMMUNITY): Payer: 59

## 2013-06-06 ENCOUNTER — Encounter (HOSPITAL_COMMUNITY): Payer: Self-pay | Admitting: Emergency Medicine

## 2013-06-06 ENCOUNTER — Emergency Department (HOSPITAL_COMMUNITY)
Admission: EM | Admit: 2013-06-06 | Discharge: 2013-06-06 | Disposition: A | Discharge status: Home / Self Care | Payer: 59 | Attending: Emergency Medicine | Admitting: Emergency Medicine

## 2013-06-06 DIAGNOSIS — S62309A Unspecified fracture of unspecified metacarpal bone, initial encounter for closed fracture: Secondary | ICD-10-CM | POA: Insufficient documentation

## 2013-06-06 DIAGNOSIS — F17200 Nicotine dependence, unspecified, uncomplicated: Secondary | ICD-10-CM | POA: Insufficient documentation

## 2013-06-06 DIAGNOSIS — Z79899 Other long term (current) drug therapy: Secondary | ICD-10-CM | POA: Insufficient documentation

## 2013-06-06 DIAGNOSIS — Y929 Unspecified place or not applicable: Secondary | ICD-10-CM | POA: Insufficient documentation

## 2013-06-06 DIAGNOSIS — S62308A Unspecified fracture of other metacarpal bone, initial encounter for closed fracture: Secondary | ICD-10-CM

## 2013-06-06 DIAGNOSIS — Y9389 Activity, other specified: Secondary | ICD-10-CM | POA: Insufficient documentation

## 2013-06-06 DIAGNOSIS — W2209XA Striking against other stationary object, initial encounter: Secondary | ICD-10-CM | POA: Insufficient documentation

## 2013-06-06 MED ORDER — HYDROCODONE-ACETAMINOPHEN 5-325 MG PO TABS: 2.0000 | ORAL_TABLET | ORAL | Status: DC | PRN

## 2013-06-06 NOTE — ED Provider Notes (Signed)
Medical screening examination/treatment/procedure(s) were performed by non-physician practitioner and as supervising physician I was immediately available for consultation/collaboration.  EKG Interpretation   None       Devoria Albe, MD, Armando Gang   Ward Givens, MD 06/06/13 1248

## 2013-06-06 NOTE — ED Notes (Signed)
Pt reports hitting a wall with a closed fist last night. Pain and swelling to right hand with bruising on palm. Pulses present to right hand.

## 2013-06-06 NOTE — ED Provider Notes (Signed)
CSN: [id number]     Arrival date & time 06/06/13  1112 History   First MD Initiated Contact with Patient 06/06/13 1140     Chief Complaint  Patient presents with  . Hand Injury   (Consider location/radiation/quality/duration/timing/severity/associated sxs/prior Treatment) Patient is a 27 y.o. male presenting with hand injury. The history is provided by the patient. No language interpreter was used.  Hand Injury Location:  Hand Time since incident:  1 day Injury: yes   Hand location:  R hand Pain details:    Quality:  Aching   Radiates to:  Does not radiate   Severity:  Moderate   Onset quality:  Gradual   Timing:  Constant   Progression:  Worsening Chronicity:  New Pt hit a wall with his fist  Past Medical History  Diagnosis Date  . MI (myocardial infarction)   . Marijuana abuse   . Tobacco abuse    Past Surgical History  Procedure Laterality Date  . Appendectomy      Age 27   Family History  Problem Relation Age of Onset  . Coronary artery disease Mother    History  Substance Use Topics  . Smoking status: Current Every Day Smoker -- 0.50 packs/day for 6 years    Types: Cigarettes  . Smokeless tobacco: Never Used  . Alcohol Use: 1.2 - 3.6 oz/week    2-6 Cans of beer per week     Comment: socially    Review of Systems  All other systems reviewed and are negative.    Allergies  Review of patient's allergies indicates no known allergies.  Home Medications   Current Outpatient Rx  Name  Route  Sig  Dispense  Refill  . ibuprofen (ADVIL,MOTRIN) 200 MG tablet   Oral   Take 400 mg by mouth every 6 (six) hours as needed.         Marland Kitchen lisdexamfetamine (VYVANSE) 40 MG capsule   Oral   Take 40 mg by mouth daily.          BP 136/80  Temp(Src) 98.3 F (36.8 C)  Resp 16  SpO2 100% Physical Exam  Nursing note and vitals reviewed. Constitutional: He is oriented to person, place, and time. He appears well-nourished.  HENT:  Head: Normocephalic.   Pulmonary/Chest: Effort normal.  Musculoskeletal: He exhibits tenderness.  Swollen right hand at 5th metacarpal  nv and ns intact  Neurological: He is alert and oriented to person, place, and time. He has normal reflexes.  Skin: Skin is warm.  Psychiatric: He has a normal mood and affect.    ED Course  Procedures (including critical care time) Labs Review Labs Reviewed - No data to display Imaging Review Dg Hand Complete Right  06/06/2013   CLINICAL DATA:  Punched a wall 1 day ago with right hand pain.  EXAM: RIGHT HAND - COMPLETE 3+ VIEW  COMPARISON:  None.  FINDINGS: There is a 2 mm anteriorly displaced comminuted fracture of the 5th metacarpal neck with apex dorsal angulation. There is overlying soft tissue swelling.  There is no other fracture or dislocation.  IMPRESSION: Comminuted fracture of the right 5th metacarpal neck with apex dorsal angulation and anterior displacement.   Electronically Signed   By: Elige Ko   On: 06/06/2013 12:06    EKG Interpretation   None       MDM   1. Closed fracture of 5th metacarpal, initial encounter    Splint,  Follow up with Dr. Amanda Pea,   Hydrocodone  for pain    Elson Areas, PA-C 06/06/13 1244

## 2017-01-13 ENCOUNTER — Emergency Department (HOSPITAL_COMMUNITY): Admission: EM | Admit: 2017-01-13 | Discharge: 2017-01-13 | Discharge status: Against Medical Advice | Payer: Self-pay

## 2017-01-13 ENCOUNTER — Emergency Department (HOSPITAL_COMMUNITY): Payer: BLUE CROSS/BLUE SHIELD

## 2017-01-13 ENCOUNTER — Emergency Department (HOSPITAL_COMMUNITY)
Admission: EM | Admit: 2017-01-13 | Discharge: 2017-01-14 | Disposition: A | Discharge status: Home / Self Care | Payer: BLUE CROSS/BLUE SHIELD | Attending: Emergency Medicine | Admitting: Emergency Medicine

## 2017-01-13 DIAGNOSIS — S91112A Laceration without foreign body of left great toe without damage to nail, initial encounter: Secondary | ICD-10-CM | POA: Diagnosis not present

## 2017-01-13 DIAGNOSIS — Y9389 Activity, other specified: Secondary | ICD-10-CM | POA: Diagnosis not present

## 2017-01-13 DIAGNOSIS — S0181XA Laceration without foreign body of other part of head, initial encounter: Secondary | ICD-10-CM | POA: Diagnosis not present

## 2017-01-13 DIAGNOSIS — S91109A Unspecified open wound of unspecified toe(s) without damage to nail, initial encounter: Secondary | ICD-10-CM

## 2017-01-13 DIAGNOSIS — F17210 Nicotine dependence, cigarettes, uncomplicated: Secondary | ICD-10-CM | POA: Insufficient documentation

## 2017-01-13 DIAGNOSIS — Y929 Unspecified place or not applicable: Secondary | ICD-10-CM | POA: Insufficient documentation

## 2017-01-13 DIAGNOSIS — S80211A Abrasion, right knee, initial encounter: Secondary | ICD-10-CM | POA: Diagnosis not present

## 2017-01-13 DIAGNOSIS — M25541 Pain in joints of right hand: Secondary | ICD-10-CM | POA: Diagnosis not present

## 2017-01-13 DIAGNOSIS — Y999 Unspecified external cause status: Secondary | ICD-10-CM | POA: Diagnosis not present

## 2017-01-13 DIAGNOSIS — Z79899 Other long term (current) drug therapy: Secondary | ICD-10-CM | POA: Insufficient documentation

## 2017-01-13 DIAGNOSIS — M79641 Pain in right hand: Secondary | ICD-10-CM | POA: Diagnosis not present

## 2017-01-13 DIAGNOSIS — S0990XA Unspecified injury of head, initial encounter: Secondary | ICD-10-CM | POA: Diagnosis present

## 2017-01-13 MED ORDER — ACETAMINOPHEN 500 MG PO TABS
1000.0000 mg | ORAL_TABLET | Freq: Once | ORAL | Status: AC
Start: 2017-01-13 — End: 2017-01-13
  Administered 2017-01-13: 1000 mg via ORAL
  Filled 2017-01-13: qty 2

## 2017-01-13 NOTE — ED Notes (Signed)
Patient transported to CT 
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
## 2017-01-13 NOTE — ED Notes (Signed)
Pt ambulatory upon arrival to triage. Steady gait noted.

## 2017-01-13 NOTE — ED Notes (Signed)
See EDP assessment 

## 2017-01-14 DIAGNOSIS — F909 Attention-deficit hyperactivity disorder, unspecified type: Secondary | ICD-10-CM | POA: Diagnosis not present

## 2017-01-14 DIAGNOSIS — S0181XA Laceration without foreign body of other part of head, initial encounter: Secondary | ICD-10-CM | POA: Diagnosis not present

## 2017-01-14 MED ORDER — CEPHALEXIN 500 MG PO CAPS: 500.0000 mg | ORAL_CAPSULE | Freq: Four times a day (QID) | ORAL | 0 refills | Status: AC

## 2017-01-14 MED ORDER — LIDOCAINE HCL 2 % IJ SOLN
10.0000 mL | Freq: Once | INTRAMUSCULAR | Status: AC
  Administered 2017-01-14: 200 mg via INTRADERMAL
  Filled 2017-01-14: qty 20

## 2017-01-14 NOTE — ED Notes (Signed)
EDP remains in room suturing pt at this time.

## 2017-01-14 NOTE — ED Notes (Signed)
EDP in room suturing patient at this time.

## 2017-01-14 NOTE — Discharge Instructions (Signed)
Please take all of your antibiotics until finished!   You may develop abdominal discomfort or diarrhea from the antibiotic.  You may help offset this with probiotics which you can buy or get in yogurt. Do not eat  or take the probiotics until 2 hours after your antibiotic.   Take 600 mg ibuprofen or 500 mg of Tylenol as needed for pain. Apply ice or heat to affected areas for comfort. Follow-up with your primary care physician tomorrow as scheduled, and then 7 days later for suture removal. Return to the ED if any concerning signs or symptoms develop immediately such as fever, chills, swelling or redness or drainage of pus.

## 2017-01-15 DIAGNOSIS — T1490XA Injury, unspecified, initial encounter: Secondary | ICD-10-CM | POA: Diagnosis not present

## 2017-01-16 ENCOUNTER — Ambulatory Visit (HOSPITAL_COMMUNITY)
Admission: EM | Admit: 2017-01-16 | Discharge: 2017-01-16 | Disposition: A | Discharge status: Home / Self Care | Payer: BLUE CROSS/BLUE SHIELD | Attending: Family Medicine | Admitting: Family Medicine

## 2017-01-16 ENCOUNTER — Encounter (HOSPITAL_COMMUNITY): Payer: Self-pay | Admitting: Emergency Medicine

## 2017-01-16 DIAGNOSIS — R220 Localized swelling, mass and lump, head: Secondary | ICD-10-CM | POA: Diagnosis not present

## 2017-01-16 DIAGNOSIS — S0180XA Unspecified open wound of other part of head, initial encounter: Secondary | ICD-10-CM | POA: Diagnosis not present

## 2017-01-16 MED ORDER — BACITRACIN ZINC 500 UNIT/GM EX OINT
TOPICAL_OINTMENT | CUTANEOUS | Status: AC
  Filled 2017-01-16: qty 0.9

## 2017-01-16 MED ORDER — HYDROCODONE-ACETAMINOPHEN 5-325 MG PO TABS: 1.0000 | ORAL_TABLET | Freq: Four times a day (QID) | ORAL | 0 refills | Status: DC | PRN
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
## 2017-07-31 ENCOUNTER — Ambulatory Visit (HOSPITAL_COMMUNITY)
Admission: EM | Admit: 2017-07-31 | Discharge: 2017-07-31 | Disposition: A | Discharge status: Home / Self Care | Payer: BLUE CROSS/BLUE SHIELD | Attending: Family Medicine | Admitting: Family Medicine

## 2017-07-31 ENCOUNTER — Encounter (HOSPITAL_COMMUNITY): Payer: Self-pay | Admitting: Family Medicine

## 2017-07-31 DIAGNOSIS — M5432 Sciatica, left side: Secondary | ICD-10-CM

## 2017-07-31 MED ORDER — PREDNISONE 20 MG PO TABS: ORAL_TABLET | ORAL | 0 refills | Status: DC

## 2017-07-31 MED ORDER — HYDROCODONE-ACETAMINOPHEN 5-325 MG PO TABS: 1.0000 | ORAL_TABLET | Freq: Four times a day (QID) | ORAL | 0 refills | Status: DC | PRN

## 2017-07-31 NOTE — ED Triage Notes (Signed)
PT C/O: constant back pain and left hip pain onset that he felt after waking up this morning  Sts he was fine last night  DENIES: denies inj/trauma, numbness tingly.... sts pain does radiate down legs  TAKING MEDS: Ibuprofen today at 0900  A&O x4... NAD... Ambulatory ... Slow gait.

## 2017-07-31 NOTE — ED Provider Notes (Signed)
[facility]   [id number] 07/31/17 Arrival Time: 1200   SUBJECTIVE:  Joel Sandoval is a 32 y.o. male who presents to the urgent care with complaint of left lower back pain which began this morning.  Patient works as a Investment banker, operational at [employer]  He was doing his normal shift yesterday and did not experience anything unusual although he was standing and bending over for more than he usually does.  When he woke up this morning he had persistent left lower back pain without radiation.  The pain is worse when he tries to stand or bend over.  He has no fever, trouble passing his water, weakness, or radicular symptoms.    Past Medical History:  Diagnosis Date  . Marijuana abuse   . MI (myocardial infarction) (HCC)   . Tobacco abuse    Family History  Problem Relation Age of Onset  . Coronary artery disease Mother    Social History   Socioeconomic History  . Marital status: Single    Spouse name: Not on file  . Number of children: Not on file  . Years of education: Not on file  . Highest education level: Not on file  Social Needs  . Financial resource strain: Not on file  . Food insecurity - worry: Not on file  . Food insecurity - inability: Not on file  . Transportation needs - medical: Not on file  . Transportation needs - non-medical: Not on file  Occupational History  . Not on file  Tobacco Use  . Smoking status: Current Every Day Smoker    Packs/day: 0.50    Years: 6.00    Pack years: 3.00    Types: Cigarettes  . Smokeless tobacco: Never Used  Substance and Sexual Activity  . Alcohol use: Yes    Alcohol/week: 1.2 - 3.6 oz    Types: 2 - 6 Cans of beer per week    Comment: socially  . Drug use: Yes    Types: Marijuana  . Sexual activity: Not on file  Other Topics Concern  . Not on file  Social History Narrative  . Not on file   Current Meds  Medication Sig  . lisdexamfetamine (VYVANSE) 40 MG capsule Take 40 mg by mouth daily.   No Known  Allergies    ROS: As per HPI, remainder of ROS negative.   OBJECTIVE:   Vitals:   07/31/17 1215  BP: (!) 160/114  Pulse: (!) 102  Resp: 18  Temp: 97.6 F (36.4 C)  TempSrc: Oral  SpO2: 98%     General appearance: alert; no distress Eyes: PERRL; EOMI; conjunctiva normal HENT: normocephalic; atraumatic; TMs normal, canal normal, external ears normal without trauma; nasal mucosa normal; oral mucosa normal Neck: supple Back: no CVA tenderness Extremities: no cyanosis or edema; symmetrical with no gross deformities; positive straight leg raising each side, worse on left Skin: warm and dry Neurologic: normal gait; grossly normal Psychological: alert and cooperative; normal mood and affect      Labs:  Results for orders placed or performed during the hospital encounter of 04/28/13  POCT urinalysis dip (device)  Result Value Ref Range   Glucose, UA NEGATIVE NEGATIVE mg/dL   Bilirubin Urine NEGATIVE NEGATIVE   Ketones, ur NEGATIVE NEGATIVE mg/dL   Specific Gravity, Urine >=1.030 1.005 - 1.030   Hgb urine dipstick NEGATIVE NEGATIVE   pH 7.0 5.0 - 8.0   Protein, ur NEGATIVE NEGATIVE mg/dL   Urobilinogen, UA 0.2 0.0 - 1.0 mg/dL  Nitrite NEGATIVE NEGATIVE   Leukocytes, UA NEGATIVE NEGATIVE  I-STAT, chem 8  Result Value Ref Range   Sodium 138 135 - 145 mEq/L   Potassium 4.6 3.5 - 5.1 mEq/L   Chloride 100 96 - 112 mEq/L   BUN 8 6 - 23 mg/dL   Creatinine, Ser 5.780.90 0.50 - 1.35 mg/dL   Glucose, Bld 96 70 - 99 mg/dL   Calcium, Ion 4.691.21 6.291.12 - 1.23 mmol/L   TCO2 29 0 - 100 mmol/L   Hemoglobin 15.6 13.0 - 17.0 g/dL   HCT 52.846.0 41.339.0 - 24.452.0 %    Labs Reviewed - No data to display  No results found.     ASSESSMENT & PLAN:  1. Sciatica of left side     Meds ordered this encounter  Medications  . HYDROcodone-acetaminophen (NORCO/VICODIN) 5-325 MG tablet    Sig: Take 1 tablet by mouth every 6 (six) hours as needed.    Dispense:  15 tablet    Refill:  0  .  predniSONE (DELTASONE) 20 MG tablet    Sig: Two daily with food    Dispense:  10 tablet    Refill:  0    Reviewed expectations re: course of current medical issues. Questions answered. Outlined signs and symptoms indicating need for more acute intervention. Patient verbalized understanding. After Visit Summary given.    Procedures:      Elvina SidleLauenstein, Burnette Sautter, MD 07/31/17 1231

## 2017-08-02 DIAGNOSIS — M545 Low back pain: Secondary | ICD-10-CM | POA: Diagnosis not present

## 2017-10-18 ENCOUNTER — Other Ambulatory Visit: Payer: Self-pay

## 2017-10-18 ENCOUNTER — Ambulatory Visit (HOSPITAL_COMMUNITY)
Admission: EM | Admit: 2017-10-18 | Discharge: 2017-10-18 | Disposition: A | Discharge status: Home / Self Care | Payer: BLUE CROSS/BLUE SHIELD | Attending: Family Medicine | Admitting: Family Medicine

## 2017-10-18 ENCOUNTER — Encounter (HOSPITAL_COMMUNITY): Payer: Self-pay | Admitting: Emergency Medicine

## 2017-10-18 DIAGNOSIS — J40 Bronchitis, not specified as acute or chronic: Secondary | ICD-10-CM | POA: Diagnosis not present

## 2017-10-18 DIAGNOSIS — J209 Acute bronchitis, unspecified: Secondary | ICD-10-CM

## 2017-10-18 MED ORDER — FLUTICASONE PROPIONATE 50 MCG/ACT NA SUSP: 1.0000 | Freq: Every day | NASAL | 0 refills | Status: DC

## 2017-10-18 MED ORDER — AZITHROMYCIN 250 MG PO TABS: 250.0000 mg | ORAL_TABLET | Freq: Every day | ORAL | 0 refills | Status: DC

## 2017-10-18 NOTE — ED Provider Notes (Signed)
[facility]    CSN: [id number] Arrival date & time: 10/18/17  1918     History   Chief Complaint Cough  HPI Joel Sandoval is a 32 y.o. male.   32 yo male here for cough with sputum production that is worsening x 5 days. He admits to nasal congestion and sore throat. Denies fever, sweats, chills.      Past Medical History:  Diagnosis Date  . Marijuana abuse   . MI (myocardial infarction) (HCC)   . Tobacco abuse     Patient Active Problem List   Diagnosis Date Noted  . ANXIETY STATE, UNSPECIFIED 07/22/2010  . TOBACCO ABUSE 07/22/2010  . MARIJUANA ABUSE 07/22/2010  . MYOCARDIAL INFARCTION, ACUTE, NON-Q WAVE 07/22/2010    Past Surgical History:  Procedure Laterality Date  . APPENDECTOMY     Age 32       Home Medications    Prior to Admission medications   Medication Sig Start Date End Date Taking? Authorizing Provider  HYDROcodone-acetaminophen (NORCO/VICODIN) 5-325 MG tablet Take 1 tablet by mouth every 6 (six) hours as needed. 07/31/17   Elvina SidleLauenstein, Kurt, MD  lisdexamfetamine (VYVANSE) 40 MG capsule Take 40 mg by mouth daily.    [provider]  predniSONE (DELTASONE) 20 MG tablet Two daily with food 07/31/17   Elvina SidleLauenstein, Kurt, MD    Family History Family History  Problem Relation Age of Onset  . Coronary artery disease Mother     Social History Social History   Tobacco Use  . Smoking status: Current Every Day Smoker    Packs/day: 0.50    Years: 6.00    Pack years: 3.00    Types: Cigarettes  . Smokeless tobacco: Never Used  Substance Use Topics  . Alcohol use: Yes    Alcohol/week: 1.2 - 3.6 oz    Types: 2 - 6 Cans of beer per week    Comment: socially  . Drug use: Yes    Types: Marijuana     Allergies   Patient has no known allergies.   Review of Systems Review of Systems  Constitutional: Negative for activity change and chills.  HENT: Positive for congestion and rhinorrhea.   Eyes: Negative for discharge and  itching.  Respiratory: Negative for apnea and cough.   Cardiovascular: Negative for chest pain and leg swelling.  Gastrointestinal: Negative for abdominal distention and diarrhea.  Endocrine: Negative for cold intolerance and heat intolerance.  Genitourinary: Negative for difficulty urinating and dysuria.  Musculoskeletal: Negative for arthralgias and back pain.  Neurological: Negative for dizziness and headaches.  Hematological: Negative for adenopathy. Does not bruise/bleed easily.     Physical Exam Triage Vital Signs ED Triage Vitals  Enc Vitals Group     BP      Pulse      Resp      Temp      Temp src      SpO2      Weight      Height      Head Circumference      Peak Flow      Pain Score      Pain Loc      Pain Edu?      Excl. in GC?    No data found.  Updated Vital Signs There were no vitals taken for this visit.  Visual Acuity Right Eye Distance:   Left Eye Distance:   Bilateral Distance:    Right Eye Near:   Left Eye  Near:    Bilateral Near:     Physical Exam  Constitutional: He is oriented to person, place, and time. He appears well-developed and well-nourished.  HENT:  Head: Normocephalic and atraumatic.  Right Ear: Tympanic membrane normal.  Left Ear: Tympanic membrane normal.  Eyes: Pupils are equal, round, and reactive to light. EOM are normal.  Neck: Normal range of motion. Neck supple.  Cardiovascular: Normal rate and intact distal pulses.  Pulmonary/Chest: Effort normal and breath sounds normal. No respiratory distress.  Abdominal: Soft. There is no tenderness.  Musculoskeletal: Normal range of motion. He exhibits no edema.  Neurological: He is alert and oriented to person, place, and time.  Skin: Skin is warm and dry.  Psychiatric: He has a normal mood and affect. His behavior is normal.     UC Treatments / Results  Labs (all labs ordered are listed, but only abnormal results are displayed) Labs Reviewed - No data to  display  EKG None Radiology No results found.  Procedures Procedures (including critical care time)  Medications Ordered in UC Medications - No data to display   Initial Impression / Assessment and Plan / UC Course  I have reviewed the triage vital signs and the nursing notes.  Pertinent labs & imaging results that were available during my care of the patient were reviewed by me and considered in my medical decision making (see chart for details).     1. Acute bronchitis- likely viral but patient desires antibiotics. Explained that antibiotics might not help if viral. He understands.   Final Clinical Impressions(s) / UC Diagnoses   Final diagnoses:  None    ED Discharge Orders    None       Controlled Substance Prescriptions [place] Controlled Substance Registry consulted? Not Applicable   Rolm Bookbinder, DO 10/18/17 1950

## 2017-10-18 NOTE — ED Triage Notes (Signed)
[facility] a [facility] sore [facility], cough and nasal congestion x 3 days. The patient denied any fever at home.

## 2018-01-20 DIAGNOSIS — Z0000 Encounter for general adult medical examination without abnormal findings: Secondary | ICD-10-CM | POA: Diagnosis not present

## 2018-01-20 DIAGNOSIS — I252 Old myocardial infarction: Secondary | ICD-10-CM | POA: Diagnosis not present

## 2018-01-20 DIAGNOSIS — F909 Attention-deficit hyperactivity disorder, unspecified type: Secondary | ICD-10-CM | POA: Diagnosis not present

## 2018-01-20 DIAGNOSIS — E669 Obesity, unspecified: Secondary | ICD-10-CM | POA: Diagnosis not present

## 2018-01-20 DIAGNOSIS — F1010 Alcohol abuse, uncomplicated: Secondary | ICD-10-CM | POA: Diagnosis not present

## 2018-02-09 DIAGNOSIS — M546 Pain in thoracic spine: Secondary | ICD-10-CM | POA: Diagnosis not present

## 2018-02-09 DIAGNOSIS — M9901 Segmental and somatic dysfunction of cervical region: Secondary | ICD-10-CM | POA: Diagnosis not present

## 2018-02-09 DIAGNOSIS — M9902 Segmental and somatic dysfunction of thoracic region: Secondary | ICD-10-CM | POA: Diagnosis not present

## 2018-02-09 DIAGNOSIS — M542 Cervicalgia: Secondary | ICD-10-CM | POA: Diagnosis not present

## 2018-03-01 DIAGNOSIS — M9902 Segmental and somatic dysfunction of thoracic region: Secondary | ICD-10-CM | POA: Diagnosis not present

## 2018-03-01 DIAGNOSIS — M9901 Segmental and somatic dysfunction of cervical region: Secondary | ICD-10-CM | POA: Diagnosis not present

## 2018-03-01 DIAGNOSIS — M546 Pain in thoracic spine: Secondary | ICD-10-CM | POA: Diagnosis not present

## 2018-03-01 DIAGNOSIS — M542 Cervicalgia: Secondary | ICD-10-CM | POA: Diagnosis not present

## 2018-04-10 ENCOUNTER — Encounter (HOSPITAL_COMMUNITY): Payer: Self-pay | Admitting: Emergency Medicine

## 2018-04-10 ENCOUNTER — Ambulatory Visit (HOSPITAL_COMMUNITY)
Admission: EM | Admit: 2018-04-10 | Discharge: 2018-04-10 | Disposition: A | Discharge status: Home / Self Care | Payer: BLUE CROSS/BLUE SHIELD | Attending: Family Medicine | Admitting: Family Medicine

## 2018-04-10 ENCOUNTER — Ambulatory Visit (INDEPENDENT_AMBULATORY_CARE_PROVIDER_SITE_OTHER): Payer: BLUE CROSS/BLUE SHIELD

## 2018-04-10 DIAGNOSIS — J069 Acute upper respiratory infection, unspecified: Secondary | ICD-10-CM

## 2018-04-10 DIAGNOSIS — R05 Cough: Secondary | ICD-10-CM | POA: Diagnosis not present

## 2018-04-10 DIAGNOSIS — R062 Wheezing: Secondary | ICD-10-CM

## 2018-04-10 MED ORDER — IPRATROPIUM-ALBUTEROL 0.5-2.5 (3) MG/3ML IN SOLN
3.0000 mL | Freq: Once | RESPIRATORY_TRACT | Status: AC
  Administered 2018-04-10: 3 mL via RESPIRATORY_TRACT

## 2018-04-10 MED ORDER — FLUTICASONE PROPIONATE 50 MCG/ACT NA SUSP: 1.0000 | Freq: Every day | NASAL | 0 refills | Status: DC

## 2018-04-10 MED ORDER — PREDNISONE 20 MG PO TABS: 40.0000 mg | ORAL_TABLET | Freq: Every day | ORAL | 0 refills | Status: AC

## 2018-04-10 MED ORDER — ALBUTEROL SULFATE HFA 108 (90 BASE) MCG/ACT IN AERS: 1.0000 | INHALATION_SPRAY | Freq: Four times a day (QID) | RESPIRATORY_TRACT | 0 refills | Status: DC | PRN

## 2018-04-10 MED ORDER — IPRATROPIUM-ALBUTEROL 0.5-2.5 (3) MG/3ML IN SOLN
RESPIRATORY_TRACT | Status: AC
  Filled 2018-04-10: qty 3

## 2018-04-10 NOTE — ED Triage Notes (Signed)
Pt sts URI sx with sore throat and cough

## 2018-04-10 NOTE — ED Provider Notes (Signed)
[facility]    CSN: [id number] Arrival date & time: 04/10/18  1612     History   Chief Complaint Chief Complaint  Patient presents with  . URI    HPI Joel Sandoval is a 32 y.o. male.   Joel Sandoval presents with complaints of sinus pressure, congestion, sore throat, cough which worsened since yesterday. Had mild symptoms a few days prior such as itchy throat. starte zycam and recola which did help some. No known fevers. No gi/gu complaints. No ear pain. No known ill contacts specifically. Has been eating and drinking. Feels shortness of breath  At times. Wheezing. Took waltussin which did help. Cough is dry, non productive. No history of asthma but does smoke approximately 1ppd. Hx of MI, is not on any prescribed medications. States was given antihypertensives but no longer takes, does not follow with a PCP.    ROS per HPI.      Past Medical History:  Diagnosis Date  . Marijuana abuse   . MI (myocardial infarction) (HCC)   . Tobacco abuse     Patient Active Problem List   Diagnosis Date Noted  . ANXIETY STATE, UNSPECIFIED 07/22/2010  . TOBACCO ABUSE 07/22/2010  . MARIJUANA ABUSE 07/22/2010  . MYOCARDIAL INFARCTION, ACUTE, NON-Q WAVE 07/22/2010    Past Surgical History:  Procedure Laterality Date  . APPENDECTOMY     Age 40       Home Medications    Prior to Admission medications   Medication Sig Start Date End Date Taking? Authorizing Provider  albuterol (PROVENTIL HFA;VENTOLIN HFA) 108 (90 Base) MCG/ACT inhaler Inhale 1-2 puffs into the lungs every 6 (six) hours as needed for wheezing or shortness of breath. 04/10/18   Georgetta Haber, NP  fluticasone (FLONASE) 50 MCG/ACT nasal spray Place 1 spray into both nostrils daily. 04/10/18   Georgetta Haber, NP  lisdexamfetamine (VYVANSE) 40 MG capsule Take 40 mg by mouth daily.    [provider]  predniSONE (DELTASONE) 20 MG tablet Take 2 tablets (40 mg total) by mouth daily with breakfast for 5 days.  04/10/18 04/15/18  Georgetta Haber, NP    Family History Family History  Problem Relation Age of Onset  . Coronary artery disease Mother     Social History Social History   Tobacco Use  . Smoking status: Current Every Day Smoker    Packs/day: 0.50    Years: 6.00    Pack years: 3.00    Types: Cigarettes  . Smokeless tobacco: Never Used  Substance Use Topics  . Alcohol use: Yes    Alcohol/week: 2.0 - 6.0 standard drinks    Types: 2 - 6 Cans of beer per week    Comment: socially  . Drug use: Yes    Types: Marijuana     Allergies   Patient has no known allergies.   Review of Systems Review of Systems   Physical Exam Triage Vital Signs ED Triage Vitals [04/10/18 1639]  Enc Vitals Group     BP (!) 144/92     Pulse Rate 95     Resp 18     Temp 97.9 F (36.6 C)     Temp Source Oral     SpO2 97 %     Weight      Height      Head Circumference      Peak Flow      Pain Score      Pain Loc  Pain Edu?      Excl. in GC?    No data found.  Updated Vital Signs BP (!) 144/92 (BP Location: Right Arm)   Pulse 95   Temp 97.9 F (36.6 C) (Oral)   Resp 18   SpO2 97%    Physical Exam  Constitutional: He is oriented to person, place, and time. He appears well-developed and well-nourished.  HENT:  Head: Normocephalic and atraumatic.  Right Ear: Tympanic membrane, external ear and ear canal normal.  Left Ear: Tympanic membrane, external ear and ear canal normal.  Nose: Rhinorrhea present. Right sinus exhibits no maxillary sinus tenderness and no frontal sinus tenderness. Left sinus exhibits no maxillary sinus tenderness and no frontal sinus tenderness.  Mouth/Throat: Uvula is midline, oropharynx is clear and moist and mucous membranes are normal.  Eyes: Pupils are equal, round, and reactive to light. Conjunctivae are normal.  Neck: Normal range of motion.  Cardiovascular: Normal rate and regular rhythm.  Pulmonary/Chest: Effort normal. He has wheezes.    Primarily to upper lobes noted; cough illicited with deep breathing   Lymphadenopathy:    He has no cervical adenopathy.  Neurological: He is alert and oriented to person, place, and time.  Skin: Skin is warm and dry.  Vitals reviewed.    UC Treatments / Results  Labs (all labs ordered are listed, but only abnormal results are displayed) Labs Reviewed - No data to display  EKG None  Radiology Dg Chest 2 View  Result Date: 04/10/2018 CLINICAL DATA:  Cough and wheezing for 2 days. EXAM: CHEST - 2 VIEW COMPARISON:  02/26/2011 FINDINGS: The heart size and mediastinal contours are within normal limits. Both lungs are clear. The visualized skeletal structures are unremarkable. IMPRESSION: Negative.  No active cardiopulmonary disease. Electronically Signed   By: Myles Rosenthal M.D.   On: 04/10/2018 17:21    Procedures Procedures (including critical care time)  Medications Ordered in UC Medications  ipratropium-albuterol (DUONEB) 0.5-2.5 (3) MG/3ML nebulizer solution 3 mL (3 mLs Nebulization Given 04/10/18 1728)    Initial Impression / Assessment and Plan / UC Course  I have reviewed the triage vital signs and the nursing notes.  Pertinent labs & imaging results that were available during my care of the patient were reviewed by me and considered in my medical decision making (see chart for details).     Significant improvement to lung sounds on auscultation s/p nebulizer. Xray reassuring here today. Afebrile. No tachycardia or tachypnea. History and physical consistent with viral illness.  Prednisone to help with weezing provided. Albuterol prn. Supportive cares recommended. Return precautions provided.  If symptoms worsen or do not improve in the next week to return to be seen or to follow up with PCP.  Patient verbalized understanding and agreeable to plan.    Final Clinical Impressions(s) / UC Diagnoses   Final diagnoses:  Upper respiratory tract infection, unspecified type   Wheezing     Discharge Instructions     Chest xray is clear today which is reassuring.  No indications of bacterial infection here today.  Prednisone to help with cough, wheezing and congestion.   Inhaler as needed for wheezing.  Tylenol and/or ibuprofen as needed for pain or fevers.  Daily flonase to help with congestion and sore throat.  Throat lozenges, gargles, chloraseptic spray, warm teas, popsicles etc to help with throat pain.  If symptoms worsen or do not improve in the next week to return to be seen or to follow up with your  PCP.      ED Prescriptions    Medication Sig Dispense Auth. Provider   fluticasone (FLONASE) 50 MCG/ACT nasal spray Place 1 spray into both nostrils daily. 16 g Linus MakoBurky, Natalie B, NP   predniSONE (DELTASONE) 20 MG tablet Take 2 tablets (40 mg total) by mouth daily with breakfast for 5 days. 10 tablet Linus MakoBurky, Natalie B, NP   albuterol (PROVENTIL HFA;VENTOLIN HFA) 108 (90 Base) MCG/ACT inhaler Inhale 1-2 puffs into the lungs every 6 (six) hours as needed for wheezing or shortness of breath. 1 Inhaler Georgetta HaberBurky, Natalie B, NP     Controlled Substance Prescriptions [place] Controlled Substance Registry consulted? Not Applicable   Georgetta HaberBurky, Natalie B, NP 04/10/18 1735

## 2018-04-10 NOTE — Discharge Instructions (Signed)
Chest xray is clear today which is reassuring.  No indications of bacterial infection here today.  Prednisone to help with cough, wheezing and congestion.   Inhaler as needed for wheezing.  Tylenol and/or ibuprofen as needed for pain or fevers.  Daily flonase to help with congestion and sore throat.  Throat lozenges, gargles, chloraseptic spray, warm teas, popsicles etc to help with throat pain.  If symptoms worsen or do not improve in the next week to return to be seen or to follow up with your PCP.
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
## 2019-03-23 ENCOUNTER — Other Ambulatory Visit: Payer: Self-pay

## 2019-03-23 ENCOUNTER — Ambulatory Visit (HOSPITAL_COMMUNITY)
Admission: EM | Admit: 2019-03-23 | Discharge: 2019-03-23 | Disposition: A | Discharge status: Home / Self Care | Payer: BLUE CROSS/BLUE SHIELD | Attending: Family Medicine | Admitting: Family Medicine

## 2019-03-23 ENCOUNTER — Encounter (HOSPITAL_COMMUNITY): Payer: Self-pay

## 2019-03-23 DIAGNOSIS — K2900 Acute gastritis without bleeding: Secondary | ICD-10-CM

## 2019-03-23 MED ORDER — LIDOCAINE VISCOUS HCL 2 % MT SOLN
15.0000 mL | Freq: Once | OROMUCOSAL | Status: AC
  Administered 2019-03-23: 15 mL via ORAL

## 2019-03-23 MED ORDER — ALUM & MAG HYDROXIDE-SIMETH 200-200-20 MG/5ML PO SUSP
ORAL | Status: AC
  Filled 2019-03-23: qty 30

## 2019-03-23 MED ORDER — ALUM & MAG HYDROXIDE-SIMETH 200-200-20 MG/5ML PO SUSP
30.0000 mL | Freq: Once | ORAL | Status: AC
  Administered 2019-03-23: 21:00:00 30 mL via ORAL

## 2019-03-23 MED ORDER — LIDOCAINE VISCOUS HCL 2 % MT SOLN
OROMUCOSAL | Status: AC
  Filled 2019-03-23: qty 15

## 2019-03-23 MED ORDER — OMEPRAZOLE 20 MG PO CPDR: DELAYED_RELEASE_CAPSULE | ORAL | 0 refills | Status: DC

## 2019-03-23 NOTE — Discharge Instructions (Addendum)
Avoid alcohol Avoid ibuprofen and aspirin type medications, take Tylenol for pain You may use Tums/Pepto-Bismol/antacids if needed Call or return if you do not see improvement in a few days Go to the ER immediately if worse

## 2019-03-23 NOTE — ED Triage Notes (Signed)
Patient presents to Urgent Care with complaints of sharp upper mid abdominal pain since first thing this morning. Patient reports he has been drinking pepto bismol and lots of water, has tried [place] and it subsides temporarily before returning. Pt denies nausea or vomiting. Pt states he has had 6 BMs, mostly loose but not watery.

## 2019-03-23 NOTE — ED Provider Notes (Signed)
[place]    CSN: [id number] Arrival date & time: 03/23/19  1825      History   Chief Complaint Chief Complaint  Patient presents with  . Abdominal Pain    HPI Joel Sandoval is a 33 y.o. male.   HPI  Patient has a 1 day history of epigastric pain.  It is been coming and going today.  His appetite is poor.  He does get temporary relief if he takes Tums or some Pepto-Bismol.  He also tried some baking soda and warm water.  Because of the abdominal pain he came in for evaluation. He does have a history of having a heart attack in his 69s.  It was caused by vasospasm and not by coronary blockage.  He continues to smoke cigarettes.  He is on stimulant medication for ADD.  I told him that neither of these things are advisable for a patient with coronary spasm.  He is not open to suggestion of changing his habits  Past Medical History:  Diagnosis Date  . Marijuana abuse   . MI (myocardial infarction) ([place])   . Tobacco abuse     Patient Active Problem List   Diagnosis Date Noted  . ANXIETY STATE, UNSPECIFIED 07/22/2010  . TOBACCO ABUSE 07/22/2010  . MARIJUANA ABUSE 07/22/2010  . MYOCARDIAL INFARCTION, ACUTE, NON-Q WAVE 07/22/2010    Past Surgical History:  Procedure Laterality Date  . APPENDECTOMY     Age 55       Home Medications    Prior to Admission medications   Medication Sig Start Date End Date Taking? Authorizing Provider  albuterol (PROVENTIL HFA;VENTOLIN HFA) 108 (90 Base) MCG/ACT inhaler Inhale 1-2 puffs into the lungs every 6 (six) hours as needed for wheezing or shortness of breath. 04/10/18   Zigmund Gottron, NP  fluticasone (FLONASE) 50 MCG/ACT nasal spray Place 1 spray into both nostrils daily. 04/10/18   Zigmund Gottron, NP  lisdexamfetamine (VYVANSE) 40 MG capsule Take 40 mg by mouth daily.    [provider]  omeprazole (PRILOSEC) 20 MG capsule Take 1 pill twice a day before meal.  After 2 weeks go down to 1 pill a day.  After this  take only as needed. 03/23/19   Raylene Everts, MD    Family History Family History  Problem Relation Age of Onset  . Coronary artery disease Mother     Social History Social History   Tobacco Use  . Smoking status: Current Every Day Smoker    Packs/day: 0.50    Years: 6.00    Pack years: 3.00    Types: Cigarettes  . Smokeless tobacco: Never Used  Substance Use Topics  . Alcohol use: Yes    Alcohol/week: 2.0 - 6.0 standard drinks    Types: 2 - 6 Cans of beer per week    Comment: socially  . Drug use: Yes    Types: Marijuana     Allergies   Patient has no known allergies.   Review of Systems Review of Systems  Constitutional: Negative for chills and fever.  HENT: Negative for ear pain and sore throat.   Eyes: Negative for pain and visual disturbance.  Respiratory: Negative for cough and shortness of breath.   Cardiovascular: Negative for chest pain and palpitations.  Gastrointestinal: Positive for abdominal pain. Negative for vomiting.  Genitourinary: Negative for dysuria and hematuria.  Musculoskeletal: Negative for arthralgias and back pain.  Skin: Negative for color change and rash.  Neurological:  Negative for seizures and syncope.  All other systems reviewed and are negative.    Physical Exam Triage Vital Signs ED Triage Vitals  Enc Vitals Group     BP 03/23/19 1947 (!) 141/90     Pulse Rate 03/23/19 1947 (!) 115     Resp 03/23/19 1947 19     Temp 03/23/19 1947 97.8 F (36.6 C)     Temp Source 03/23/19 1947 Oral     SpO2 03/23/19 1947 96 %     Weight --      Height --      Head Circumference --      Peak Flow --      Pain Score 03/23/19 1945 6     Pain Loc --      Pain Edu? --      Excl. in [place]? --    No data found.  Updated Vital Signs BP (!) 141/90 (BP Location: Left Arm)   Pulse (!) 115   Temp 97.8 F (36.6 C) (Oral)   Resp 19   SpO2 96%      Physical Exam Constitutional:      General: He is not in acute distress.     Appearance: He is well-developed.  HENT:     Head: Normocephalic and atraumatic.  Eyes:     Conjunctiva/sclera: Conjunctivae normal.     Pupils: Pupils are equal, round, and reactive to light.  Neck:     Musculoskeletal: Normal range of motion.  Cardiovascular:     Rate and Rhythm: Regular rhythm. Tachycardia present.     Heart sounds: Normal heart sounds.  Pulmonary:     Effort: Pulmonary effort is normal. No respiratory distress.     Breath sounds: Normal breath sounds.  Abdominal:     General: Abdomen is flat. Bowel sounds are normal. There is no distension.     Palpations: Abdomen is soft.     Tenderness: There is abdominal tenderness in the epigastric area.  Musculoskeletal: Normal range of motion.  Skin:    General: Skin is warm and dry.  Neurological:     Mental Status: He is alert.      UC Treatments / Results  Labs (all labs ordered are listed, but only abnormal results are displayed) Labs Reviewed - No data to display  EKG   Radiology No results found.  Procedures Procedures (including critical care time)  Medications Ordered in UC Medications  alum & mag hydroxide-simeth (MAALOX/MYLANTA) 200-200-20 MG/5ML suspension 30 mL (30 mLs Oral Given 03/23/19 2032)    And  lidocaine (XYLOCAINE) 2 % viscous mouth solution 15 mL (15 mLs Oral Given 03/23/19 2032)  alum & mag hydroxide-simeth (MAALOX/MYLANTA) 200-200-20 MG/5ML suspension (has no administration in time range)  lidocaine (XYLOCAINE) 2 % viscous mouth solution (has no administration in time range)    Initial Impression / Assessment and Plan / UC Course  I have reviewed the triage vital signs and the nursing notes.  Pertinent labs & imaging results that were available during my care of the patient were reviewed by me and considered in my medical decision making (see chart for details).     Patient got prompt relief from acute GI cocktail.  I explained to him that that was diagnostic for stomach pain and  reassuring that he did not have any heart problems.  He did have an EKG.  Sinus tachycardia.  Normal intervals.  No ST or T wave changes Final Clinical Impressions(s) / UC Diagnoses  Final diagnoses:  Acute gastritis without hemorrhage, unspecified gastritis type     Discharge Instructions     Avoid alcohol Avoid ibuprofen and aspirin type medications, take Tylenol for pain You may use Tums/Pepto-Bismol/antacids if needed Call or return if you do not see improvement in a few days Go to the ER immediately if worse   ED Prescriptions    Medication Sig Dispense Auth. Provider   omeprazole (PRILOSEC) 20 MG capsule Take 1 pill twice a day before meal.  After 2 weeks go down to 1 pill a day.  After this take only as needed. 60 capsule Eustace Moore, MD     Controlled Substance Prescriptions [place] Controlled Substance Registry consulted? Not Applicable   Eustace Moore, MD 03/23/19 2154

## 2019-08-20 ENCOUNTER — Ambulatory Visit (HOSPITAL_COMMUNITY)
Admission: EM | Admit: 2019-08-20 | Discharge: 2019-08-20 | Disposition: A | Discharge status: Home / Self Care | Payer: No Typology Code available for payment source | Attending: Family Medicine | Admitting: Family Medicine

## 2019-08-20 ENCOUNTER — Encounter (HOSPITAL_COMMUNITY): Payer: Self-pay

## 2019-08-20 ENCOUNTER — Other Ambulatory Visit: Payer: Self-pay

## 2019-08-20 DIAGNOSIS — S3992XA Unspecified injury of lower back, initial encounter: Secondary | ICD-10-CM

## 2019-08-20 DIAGNOSIS — Z20822 Contact with and (suspected) exposure to covid-19: Secondary | ICD-10-CM

## 2019-08-20 MED ORDER — NAPROXEN 500 MG PO TABS: 500.0000 mg | ORAL_TABLET | Freq: Two times a day (BID) | ORAL | 0 refills | Status: DC

## 2019-08-20 MED ORDER — TRAMADOL HCL 50 MG PO TABS: 50.0000 mg | ORAL_TABLET | Freq: Three times a day (TID) | ORAL | 0 refills | Status: DC | PRN
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
## 2019-08-20 NOTE — Discharge Instructions (Signed)
Ice to your tailbone, use of a donut pillow to prevent pressure on the tailbone while sitting, to help with pain.  Naproxen regularly, take with food.  Tramadol for breakthrough pain. May cause drowsiness. Please do not take if driving or drinking alcohol.  May cause constipation.  If worsening of symptoms, unmanaged pain, numbness tingling weakness, loss of bladder or bowel function, please go to the Er.  If persistent symptoms please follow up with your PCP.  For testing following a covid exposure it is recommended that you wait at least 5 days after exposure in order to prevent testing too soon and getting a false negative.  Isolate until results are back. We call you if positive, if negative you will be notified on your MyChart.  If you develop symptoms of covid please isolate.
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
## 2019-08-22 LAB — NOVEL CORONAVIRUS, NAA (HOSP ORDER, SEND-OUT TO REF LAB; TAT 18-24 HRS): SARS-CoV-2, NAA: NOT DETECTED

## 2019-10-12 ENCOUNTER — Ambulatory Visit: Payer: No Typology Code available for payment source | Attending: Internal Medicine

## 2019-10-12 DIAGNOSIS — Z23 Encounter for immunization: Secondary | ICD-10-CM

## 2019-10-12 NOTE — Progress Notes (Signed)
   Covid-19 Vaccination Clinic  Name:  Joel Sandoval    MRN: [id number] DOB: 12-12-1985  10/12/2019  [name] was observed post Covid-19 immunization for 15 minutes without incident. He was provided with Vaccine Information Sheet and instruction to access the V-Safe system.   [name] was instructed to call 911 with any severe reactions post vaccine: Marland Kitchen Difficulty breathing  . Swelling of face and throat  . A fast heartbeat  . A bad rash all over body  . Dizziness and weakness   Immunizations Administered    Name Date Dose VIS Date Route   Pfizer COVID-19 Vaccine 10/12/2019  9:50 AM 0.3 mL 06/29/2019 Intramuscular   Manufacturer: [employer], [employer]   Lot: [id number]   NDC: [address]-5

## 2019-11-05 ENCOUNTER — Ambulatory Visit: Payer: No Typology Code available for payment source | Attending: Internal Medicine

## 2019-11-05 DIAGNOSIS — Z23 Encounter for immunization: Secondary | ICD-10-CM

## 2019-11-05 NOTE — Progress Notes (Signed)
   Covid-19 Vaccination Clinic  Name:  Joel Sandoval    MRN: [id number] DOB: 02/02/1986  11/05/2019  Mr. Joel Sandoval was observed post Covid-19 immunization for 15 minutes without incident. He was provided with Vaccine Information Sheet and instruction to access the V-Safe system.   Mr. Joel Sandoval was instructed to call 911 with any severe reactions post vaccine: Marland Kitchen Difficulty breathing  . Swelling of face and throat  . A fast heartbeat  . A bad rash all over body  . Dizziness and weakness   Immunizations Administered    Name Date Dose VIS Date Route   Pfizer COVID-19 Vaccine 11/05/2019  2:49 PM 0.3 mL 09/12/2018 Intramuscular   Manufacturer: [employer], [employer]   Lot: [id number]   NDC: [address]-4

## 2020-07-05 ENCOUNTER — Ambulatory Visit (HOSPITAL_COMMUNITY)
Admission: EM | Admit: 2020-07-05 | Discharge: 2020-07-05 | Disposition: A | Discharge status: Home / Self Care | Payer: No Typology Code available for payment source | Attending: Family Medicine | Admitting: Family Medicine

## 2020-07-05 ENCOUNTER — Other Ambulatory Visit: Payer: Self-pay

## 2020-07-05 ENCOUNTER — Encounter (HOSPITAL_COMMUNITY): Payer: Self-pay

## 2020-07-05 ENCOUNTER — Ambulatory Visit (HOSPITAL_COMMUNITY): Admit: 2020-07-05 | Discharge status: Absent without leave | Payer: No Typology Code available for payment source

## 2020-07-05 DIAGNOSIS — M62830 Muscle spasm of back: Secondary | ICD-10-CM

## 2020-07-05 MED ORDER — DIAZEPAM 5 MG PO TABS: 5.0000 mg | ORAL_TABLET | Freq: Every evening | ORAL | 0 refills | Status: DC | PRN

## 2020-07-05 MED ORDER — DICLOFENAC SODIUM 75 MG PO TBEC: 75.0000 mg | DELAYED_RELEASE_TABLET | Freq: Two times a day (BID) | ORAL | 0 refills | Status: DC

## 2020-07-05 NOTE — ED Triage Notes (Signed)
Pt presents with middle back pain x 2 weeks. Denies any new exercise, lifting heavy objects, fall.   Ibuprofen gives no relief.

## 2020-07-07 NOTE — ED Provider Notes (Signed)
[facility]   [id number] 07/05/20 Arrival Time: 1422  ASSESSMENT & PLAN:  1. Muscle spasm of back     Able to ambulate here and hemodynamically stable. No indication for imaging of back at this time given no trauma and normal neurological exam. Discussed.  Begin trial of: Meds ordered this encounter  Medications  . diazepam (VALIUM) 5 MG tablet    Sig: Take 1 tablet (5 mg total) by mouth at bedtime as needed for muscle spasms.    Dispense:  7 tablet    Refill:  0  . diclofenac (VOLTAREN) 75 MG EC tablet    Sig: Take 1 tablet (75 mg total) by mouth 2 (two) times daily.    Dispense:  14 tablet    Refill:  0    Medication sedation precautions given. Encourage ROM/movement as tolerated.  Recommend:  Follow-up Information    [facility].   Why: If worsening or failing to improve as anticipated. Contact information: [address] [place] [address] [phone]              Reviewed expectations re: course of current medical issues. Questions answered. Outlined signs and symptoms indicating need for more acute intervention. Patient verbalized understanding. After Visit Summary given.   SUBJECTIVE: History from: patient.  DYWANE PERUSKI is a 34 y.o. male who presents with complaint of intermittent bilateral mid back discomfort. Onset gradual. First noted about two w ago. Injury/trama: no but questions related to heavy lifting. History of back problems requiring medical care: occasional. Pain described as aching and without radiation. Aggravating factors: certain movements. Alleviating factors: have not been identified. Progressive LE weakness or saddle anesthesia: none. Extremity sensation changes or weakness: none. Ambulatory without difficulty. Normal bowel/bladder habits: yes; without urinary retention. Normal PO intake without n/v. No associated abdominal pain/n/v. Self treatment: has NSAID, with minimal  relief.  Reports no chronic steroid use, fevers, IV drug use, or recent back surgeries or procedures.  ROS: As per HPI. All other systems negative.   OBJECTIVE:  Vitals:   07/05/20 1556  BP: (!) 137/96  Pulse: 100  Resp: 18  Temp: 98.5 F (36.9 C)  TempSrc: Oral  SpO2: 100%    General appearance: alert; no distress HEENT: [place]; AT Neck: supple with FROM; without midline tenderness CV: regular Lungs: unlabored respirations; speaks full sentences without difficulty Abdomen: soft, non-tender; non-distended Back: poorly localized tenderness to palpation over thoracic paraspinal musculature; FROM at waist; bruising: none; without midline tenderness Extremities: without edema; symmetrical without gross deformities; normal ROM of bilateral LE Skin: warm and dry Neurologic: normal gait; normal sensation and strength of bilateral LE Psychological: alert and cooperative; normal mood and affect    Allergies  Allergen Reactions  . No Known Allergies     Past Medical History:  Diagnosis Date  . Marijuana abuse   . MI (myocardial infarction) ([facility])   . Tobacco abuse    Social History   Socioeconomic History  . Marital status: Single    Spouse name: Not on file  . Number of children: Not on file  . Years of education: Not on file  . Highest education level: Not on file  Occupational History  . Not on file  Tobacco Use  . Smoking status: Current Every Day Smoker    Packs/day: 0.50    Years: 6.00    Pack years: 3.00    Types: Cigarettes  . Smokeless tobacco: Never Used  Vaping  Use  . Vaping Use: Never used  Substance and Sexual Activity  . Alcohol use: Yes    Alcohol/week: 2.0 - 6.0 standard drinks    Types: 2 - 6 Cans of beer per week    Comment: socially  . Drug use: Not Currently    Types: Marijuana  . Sexual activity: Not on file  Other Topics Concern  . Not on file  Social History Narrative  . Not on file   Social Determinants of Health   Financial  Resource Strain: Not on file  Food Insecurity: Not on file  Transportation Needs: Not on file  Physical Activity: Not on file  Stress: Not on file  Social Connections: Not on file  Intimate Partner Violence: Not on file   Family History  Problem Relation Age of Onset  . Coronary artery disease Mother    Past Surgical History:  Procedure Laterality Date  . APPENDECTOMY     Age 30     Mardella Layman, MD 07/07/20 [phone]

## 2021-01-14 ENCOUNTER — Other Ambulatory Visit: Payer: Self-pay

## 2021-01-14 ENCOUNTER — Encounter (HOSPITAL_COMMUNITY): Payer: Self-pay

## 2021-01-14 ENCOUNTER — Ambulatory Visit (HOSPITAL_COMMUNITY)
Admission: EM | Admit: 2021-01-14 | Discharge: 2021-01-14 | Disposition: A | Discharge status: Home / Self Care | Payer: No Typology Code available for payment source | Attending: Emergency Medicine | Admitting: Emergency Medicine

## 2021-01-14 DIAGNOSIS — R42 Dizziness and giddiness: Secondary | ICD-10-CM | POA: Insufficient documentation

## 2021-01-14 DIAGNOSIS — R059 Cough, unspecified: Secondary | ICD-10-CM | POA: Diagnosis not present

## 2021-01-14 DIAGNOSIS — B349 Viral infection, unspecified: Secondary | ICD-10-CM | POA: Diagnosis not present

## 2021-01-14 DIAGNOSIS — F17210 Nicotine dependence, cigarettes, uncomplicated: Secondary | ICD-10-CM | POA: Insufficient documentation

## 2021-01-14 DIAGNOSIS — U071 COVID-19: Secondary | ICD-10-CM | POA: Insufficient documentation

## 2021-01-14 DIAGNOSIS — R5383 Other fatigue: Secondary | ICD-10-CM | POA: Insufficient documentation

## 2021-01-14 DIAGNOSIS — J029 Acute pharyngitis, unspecified: Secondary | ICD-10-CM | POA: Insufficient documentation

## 2021-01-14 DIAGNOSIS — R079 Chest pain, unspecified: Secondary | ICD-10-CM | POA: Insufficient documentation

## 2021-01-14 LAB — POCT RAPID STREP A, ED / UC: Streptococcus, Group A Screen (Direct): NEGATIVE

## 2021-01-14 NOTE — ED Triage Notes (Signed)
Pt reports itchy, dry throat starting yesterday, with bad sore throat and reports burning feeling in chest starting when he woke up this morning. Reports fatigue, lightheadedness. Reports only has pain in chest with coughing/breathing.

## 2021-01-14 NOTE — Discharge Instructions (Addendum)

## 2021-01-14 NOTE — ED Provider Notes (Signed)
[facility]    CSN: [id number] Arrival date & time: 01/14/21  1953      History   Chief Complaint Chief Complaint  Patient presents with   Sore Throat   Chest Pain   Fatigue   Dizziness    HPI Joel Sandoval is a 35 y.o. male.   Patient here for evaluation of sore throat, cough, and congestion that started yesterday.  Also reports some fatigue and lightheadedness.  Denies any recent sick contacts.  Reports taking OTC medications with minimal relief.  Reports fevers normal at home.  Denies any trauma, injury, or other precipitating event.  Denies any specific alleviating or aggravating factors.  Denies any fevers, chest pain, shortness of breath, N/V/D, numbness, tingling, weakness, abdominal pain, or headaches.     Sore Throat Associated symptoms include chest pain.  Chest Pain Associated symptoms: dizziness and fatigue   Associated symptoms: no dysphagia and no fever   Dizziness Associated symptoms: chest pain    Past Medical History:  Diagnosis Date   Marijuana abuse    MI (myocardial infarction) (HCC)    Tobacco abuse     Patient Active Problem List   Diagnosis Date Noted   ANXIETY STATE, UNSPECIFIED 07/22/2010   TOBACCO ABUSE 07/22/2010   MARIJUANA ABUSE 07/22/2010   MYOCARDIAL INFARCTION, ACUTE, NON-Q WAVE 07/22/2010    Past Surgical History:  Procedure Laterality Date   APPENDECTOMY     Age 27       Home Medications    Prior to Admission medications   Medication Sig Start Date End Date Taking? Authorizing Provider  albuterol (PROVENTIL HFA;VENTOLIN HFA) 108 (90 Base) MCG/ACT inhaler Inhale 1-2 puffs into the lungs every 6 (six) hours as needed for wheezing or shortness of breath. 04/10/18  Yes Burky, Dorene Grebe B, NP  diazepam (VALIUM) 5 MG tablet Take 1 tablet (5 mg total) by mouth at bedtime as needed for muscle spasms. 07/05/20  Yes Hagler, Arlys John, MD  ibuprofen (ADVIL) 200 MG tablet Take 200 mg by mouth every 6 (six) hours as needed.   Yes  [provider]  lisdexamfetamine (VYVANSE) 40 MG capsule Take 40 mg by mouth daily.   Yes [provider]  diclofenac (VOLTAREN) 75 MG EC tablet Take 1 tablet (75 mg total) by mouth 2 (two) times daily. 07/05/20   Mardella Layman, MD  fluticasone (FLONASE) 50 MCG/ACT nasal spray Place 1 spray into both nostrils daily. 04/10/18 07/05/20  Georgetta Haber, NP  omeprazole (PRILOSEC) 20 MG capsule Take 1 pill twice a day before meal.  After 2 weeks go down to 1 pill a day.  After this take only as needed. 03/23/19 07/05/20  Eustace Moore, MD    Family History Family History  Problem Relation Age of Onset   Coronary artery disease Mother     Social History Social History   Tobacco Use   Smoking status: Every Day    Packs/day: 0.50    Years: 6.00    Pack years: 3.00    Types: Cigarettes   Smokeless tobacco: Never  Vaping Use   Vaping Use: Never used  Substance Use Topics   Alcohol use: Yes    Alcohol/week: 2.0 - 6.0 standard drinks    Types: 2 - 6 Cans of beer per week    Comment: socially   Drug use: Not Currently    Types: Marijuana     Allergies   No known allergies   Review of Systems Review of Systems  Constitutional:  Positive for fatigue. Negative for fever.  HENT:  Positive for congestion and sore throat. Negative for sinus pressure, sinus pain and trouble swallowing.   Cardiovascular:  Positive for chest pain.  Neurological:  Positive for dizziness.    Physical Exam Triage Vital Signs ED Triage Vitals  Enc Vitals Group     BP 01/14/21 2004 132/87     Pulse Rate 01/14/21 2004 (!) 101     Resp 01/14/21 2004 18     Temp 01/14/21 2004 98.4 F (36.9 C)     Temp src --      SpO2 01/14/21 2004 99 %     Weight --      Height --      Head Circumference --      Peak Flow --      Pain Score 01/14/21 2002 8     Pain Loc --      Pain Edu? --      Excl. in GC? --    No data found.  Updated Vital Signs BP 132/87   Pulse (!) 101   Temp  98.4 F (36.9 C)   Resp 18   SpO2 99%   Visual Acuity Right Eye Distance:   Left Eye Distance:   Bilateral Distance:    Right Eye Near:   Left Eye Near:    Bilateral Near:     Physical Exam Vitals and nursing note reviewed.  Constitutional:      General: He is not in acute distress.    Appearance: Normal appearance. He is not ill-appearing, toxic-appearing or diaphoretic.  HENT:     Head: Normocephalic and atraumatic.     Nose: Congestion present.     Mouth/Throat:     Mouth: Mucous membranes are moist.     Pharynx: Uvula midline. Pharyngeal swelling and posterior oropharyngeal erythema present. No uvula swelling.     Tonsils: No tonsillar exudate or tonsillar abscesses. 1+ on the right. 1+ on the left.  Eyes:     Conjunctiva/sclera: Conjunctivae normal.  Cardiovascular:     Rate and Rhythm: Normal rate and regular rhythm.     Pulses: Normal pulses.     Heart sounds: Normal heart sounds.  Pulmonary:     Effort: Pulmonary effort is normal.     Breath sounds: Normal breath sounds.  Abdominal:     General: Abdomen is flat.  Musculoskeletal:        General: Normal range of motion.     Cervical back: Normal range of motion.  Skin:    General: Skin is warm and dry.  Neurological:     General: No focal deficit present.     Mental Status: He is alert and oriented to person, place, and time.  Psychiatric:        Mood and Affect: Mood normal.     UC Treatments / Results  Labs (all labs ordered are listed, but only abnormal results are displayed) Labs Reviewed  CULTURE, GROUP A STREP ([facility])  SARS CORONAVIRUS 2 (TAT 6-24 HRS)  POCT RAPID STREP A, ED / UC    EKG   Radiology No results found.  Procedures Procedures (including critical care time)  Medications Ordered in UC Medications - No data to display  Initial Impression / Assessment and Plan / UC Course  I have reviewed the triage vital signs and the nursing notes.  Pertinent labs & imaging results that  were available during my care of the patient were reviewed by  me and considered in my medical decision making (see chart for details).     Assessment negative for red flags or concern.  Rapid strep negative, throat culture pending.  COVID test pending.  Discussed patient needs to quarantine while waiting for results and then for at least 5 days if test is positive.  Work note supplied to patient.  May continue to take OTC medications for symptom relief.  Encourage fluids and rest.  May take Tylenol and/or ibuprofen as needed for pain and fevers.  Discussed conservative symptom management as described in discharge instructions.  Follow-up with primary care as needed. Final Clinical Impressions(s) / UC Diagnoses   Final diagnoses:  Viral illness     Discharge Instructions      We will contact you if your COVID test is positive.  Please quarantine while you wait for the results.  If your test is negative you may resume normal activities.  If your test is positive please continue to quarantine for at least 5 days from your symptom onset or until you are without a fever for at least 24 hours after the medications.  You can take Tylenol and/or Ibuprofen as needed for fever reduction and pain relief.    For cough: honey 1/2 to 1 teaspoon (you can dilute the honey in water or another fluid).  You can also use guaifenesin and dextromethorphan for cough. You can use a humidifier for chest congestion and cough.  If you don't have a humidifier, you can sit in the bathroom with the hot shower running.     For sore throat: try warm salt water gargles, cepacol lozenges, throat spray, warm tea or water with lemon/honey, popsicles or ice, or OTC cold relief medicine for throat discomfort.    For congestion: take a daily anti-histamine like Zyrtec, Claritin, and a oral decongestant, such as pseudoephedrine.  You can also use Flonase 1-2 sprays in each nostril daily.    It is important to stay hydrated: drink  plenty of fluids (water, gatorade/powerade/pedialyte, juices, or teas) to keep your throat moisturized and help further relieve irritation/discomfort.   Return or go to the Emergency Department if symptoms worsen or do not improve in the next few days.      ED Prescriptions   None    PDMP not reviewed this encounter.   Ivette Loyal, NP 01/14/21 2032

## 2021-01-15 ENCOUNTER — Telehealth (HOSPITAL_COMMUNITY): Payer: Self-pay | Admitting: Emergency Medicine

## 2021-01-15 LAB — SARS CORONAVIRUS 2 (TAT 6-24 HRS): SARS Coronavirus 2: POSITIVE — AB

## 2021-01-15 MED ORDER — BENZONATATE 100 MG PO CAPS: 100.0000 mg | ORAL_CAPSULE | Freq: Three times a day (TID) | ORAL | 0 refills | Status: DC

## 2021-01-15 MED ORDER — PROMETHAZINE-DM 6.25-15 MG/5ML PO SYRP: 5.0000 mL | ORAL_SOLUTION | Freq: Four times a day (QID) | ORAL | 0 refills | Status: DC | PRN

## 2021-01-15 NOTE — Telephone Encounter (Signed)
Cough medication- tessalon perles and promethazine DM, sent in for symptom management.

## 2021-01-16 LAB — CULTURE, GROUP A STREP (THRC)

## 2023-08-15 ENCOUNTER — Ambulatory Visit: Payer: Self-pay | Admitting: General Practice

## 2023-08-15 NOTE — Telephone Encounter (Signed)
  Chief Complaint: Bounding HR Symptoms: fatigue, night sweats Frequency: Intermittent Pertinent Negatives: Patient denies HA,fever, CP Disposition: [] ED /[] Urgent Care (no appt availability in office) / [x] Appointment(In office/virtual)/ []  [facility]/ [] Home Care/ [] Refused Recommended Disposition /[] [facility]/ []  Follow-up with PCP Additional Notes: Pt reports he has recently noticed bounding heart rate that follows with SOB and fatigue. Pt reports this occurred intermittently for a few days however he is currently asymptomatic. Pt reports night sweats and one episode of vomiting, history of MI at 24. Pt denies CP, HA, vision changes, fever, left arm, neck or jaw pain. OV scheduled tomorrow AM. This RN educated pt on home care, new-worsening symptoms, when to call back/seek emergent care. Pt verbalized understanding and agrees to plan.    Reason for Disposition  History of heart disease (i.e., heart attack, bypass surgery, angina, angioplasty, CHF)  (Exception: Brief heartbeat symptoms that went away and now feels well.)  Answer Assessment - Initial Assessment Questions 1. DESCRIPTION: "Please describe your heart rate or heartbeat that you are having" (e.g., fast/slow, regular/irregular, skipped or extra beats, "palpitations")     Pounding HR 2. ONSET: "When did it start?" (Minutes, hours or days)      Woke him up in the middle of the night 3. DURATION: "How long does it last" (e.g., seconds, minutes, hours)     Ongoing on and off 4. PATTERN "Does it come and go, or has it been constant since it started?"  "Does it get worse with exertion?"   "Are you feeling it now?"     Comes and goes  8. CAUSE: "What do you think is causing the palpitations?"     Unknown 9. CARDIAC HISTORY: "Do you have any history of heart disease?" (e.g., heart attack, angina, bypass surgery, angioplasty, arrhythmia)      History of MI in his 32's 10. OTHER SYMPTOMS: "Do you have any other  symptoms?" (e.g., dizziness, chest pain, sweating, difficulty breathing)       SOB, weakness, bounding heart rate, vomiting x1, night sweats  Protocols used: Heart Rate and Heartbeat Questions-A-AH

## 2023-08-16 ENCOUNTER — Ambulatory Visit: Payer: No Typology Code available for payment source | Admitting: Family

## 2023-09-14 ENCOUNTER — Ambulatory Visit (INDEPENDENT_AMBULATORY_CARE_PROVIDER_SITE_OTHER): Payer: 59 | Admitting: Nurse Practitioner

## 2023-09-14 ENCOUNTER — Telehealth: Payer: Self-pay | Admitting: Nurse Practitioner

## 2023-09-14 ENCOUNTER — Encounter: Payer: Self-pay | Admitting: Nurse Practitioner

## 2023-09-14 VITALS — BP 146/94 | HR 104 | Temp 98.1°F | Ht 70.0 in | Wt 245.0 lb

## 2023-09-14 DIAGNOSIS — E66812 Obesity, class 2: Secondary | ICD-10-CM | POA: Diagnosis not present

## 2023-09-14 DIAGNOSIS — Z1159 Encounter for screening for other viral diseases: Secondary | ICD-10-CM | POA: Diagnosis not present

## 2023-09-14 DIAGNOSIS — I10 Essential (primary) hypertension: Secondary | ICD-10-CM | POA: Diagnosis not present

## 2023-09-14 DIAGNOSIS — Z6835 Body mass index (BMI) 35.0-35.9, adult: Secondary | ICD-10-CM

## 2023-09-14 DIAGNOSIS — F909 Attention-deficit hyperactivity disorder, unspecified type: Secondary | ICD-10-CM | POA: Insufficient documentation

## 2023-09-14 LAB — LIPID PANEL
Cholesterol: 170 mg/dL (ref 0–200)
HDL: 52.9 mg/dL (ref >39.00)
LDL Cholesterol: 79 mg/dL (ref 0–99)
NonHDL: 116.69
Total CHOL/HDL Ratio: 3
Triglycerides: 187 mg/dL — ABNORMAL HIGH (ref 0.0–149.0)
VLDL: 37.4 mg/dL (ref 0.0–40.0)

## 2023-09-14 LAB — TSH: TSH: 3.5 u[IU]/mL (ref 0.35–5.50)

## 2023-09-14 LAB — COMPREHENSIVE METABOLIC PANEL
ALT: 32 U/L (ref 0–53)
AST: 36 U/L (ref 0–37)
Albumin: 4.7 g/dL (ref 3.5–5.2)
Alkaline Phosphatase: 22 U/L — ABNORMAL LOW (ref 39–117)
BUN: 8 mg/dL (ref 6–23)
CO2: 24 meq/L (ref 19–32)
Calcium: 9.1 mg/dL (ref 8.4–10.5)
Chloride: 95 meq/L — ABNORMAL LOW (ref 96–112)
Creatinine, Ser: 0.78 mg/dL (ref 0.40–1.50)
GFR: 115.31 mL/min (ref >60.00)
Glucose, Bld: 97 mg/dL (ref 70–99)
Potassium: 3.7 meq/L (ref 3.5–5.1)
Sodium: 137 meq/L (ref 135–145)
Total Bilirubin: 0.7 mg/dL (ref 0.2–1.2)
Total Protein: 7.7 g/dL (ref 6.0–8.3)

## 2023-09-14 LAB — CBC
HCT: 44.6 % (ref 39.0–52.0)
Hemoglobin: 15.8 g/dL (ref 13.0–17.0)
MCHC: 35.5 g/dL (ref 30.0–36.0)
MCV: 88.4 fl (ref 78.0–100.0)
Platelets: 228 10*3/uL (ref 150.0–400.0)
RBC: 5.04 Mil/uL (ref 4.22–5.81)
RDW: 13 % (ref 11.5–15.5)
WBC: 12.1 10*3/uL — ABNORMAL HIGH (ref 4.0–10.5)

## 2023-09-14 LAB — HEMOGLOBIN A1C: Hgb A1c MFr Bld: 5.1 % (ref 4.6–6.5)

## 2023-09-14 MED ORDER — AMLODIPINE BESYLATE 5 MG PO TABS: 5.0000 mg | ORAL_TABLET | Freq: Every day | ORAL | 0 refills | Status: DC

## 2023-09-14 NOTE — Assessment & Plan Note (Signed)
 Labs ordered, further recommendations may be made based upon his results.

## 2023-09-14 NOTE — Progress Notes (Signed)
 New Patient Office Visit  Subjective    Patient ID: Joel Sandoval, male    DOB: 1985-08-25  Age: 38 y.o. MRN: [id number]  CC:  Chief Complaint  Patient presents with   Hypertension    HPI Joel Sandoval presents to establish care He reports that he has not had a PCP in about 2 years because his previous PCP retired and then has not established since.  He reports history of hypertension as well as myocardial infarction at age 58.  Does not follow with cardiology currently.  Not on any antihypertensives.  His main concern today is he wants to discuss his hypertension and consider treatment for this.  He does not have any chest pain, shortness of breath, dizziness, headaches, blurry vision.  He also reports history of ADHD for which she was treated with Vyvanse in the past.  He may want to consider restarting treatment at some point in the future because he did find improvement in his ability to focus and concentrate specially at work while on the Vyvanse.  Outpatient Encounter Medications as of 09/14/2023  Medication Sig   amLODipine (NORVASC) 5 MG tablet Take 1 tablet (5 mg total) by mouth daily.   [DISCONTINUED] albuterol (PROVENTIL HFA;VENTOLIN HFA) 108 (90 Base) MCG/ACT inhaler Inhale 1-2 puffs into the lungs every 6 (six) hours as needed for wheezing or shortness of breath. (Patient not taking: Reported on 09/14/2023)   [DISCONTINUED] benzonatate (TESSALON) 100 MG capsule Take 1 capsule (100 mg total) by mouth every 8 (eight) hours. (Patient not taking: Reported on 09/14/2023)   [DISCONTINUED] diazepam (VALIUM) 5 MG tablet Take 1 tablet (5 mg total) by mouth at bedtime as needed for muscle spasms. (Patient not taking: Reported on 09/14/2023)   [DISCONTINUED] diclofenac (VOLTAREN) 75 MG EC tablet Take 1 tablet (75 mg total) by mouth 2 (two) times daily. (Patient not taking: Reported on 09/14/2023)   [DISCONTINUED] fluticasone (FLONASE) 50 MCG/ACT nasal spray Place 1 spray into both nostrils daily.    [DISCONTINUED] ibuprofen (ADVIL) 200 MG tablet Take 200 mg by mouth every 6 (six) hours as needed. (Patient not taking: Reported on 09/14/2023)   [DISCONTINUED] lisdexamfetamine (VYVANSE) 40 MG capsule Take 40 mg by mouth daily. (Patient not taking: Reported on 09/14/2023)   [DISCONTINUED] omeprazole (PRILOSEC) 20 MG capsule Take 1 pill twice a day before meal.  After 2 weeks go down to 1 pill a day.  After this take only as needed.   [DISCONTINUED] promethazine-dextromethorphan (PROMETHAZINE-DM) 6.25-15 MG/5ML syrup Take 5 mLs by mouth 4 (four) times daily as needed for cough. (Patient not taking: Reported on 09/14/2023)   No facility-administered encounter medications on file as of 09/14/2023.    Past Medical History:  Diagnosis Date   Marijuana abuse    MI (myocardial infarction) (HCC)    Tobacco abuse     Past Surgical History:  Procedure Laterality Date   APPENDECTOMY     Age 53    Family History  Problem Relation Age of Onset   Coronary artery disease Mother     Social History   Socioeconomic History   Marital status: Single    Spouse name: Not on file   Number of children: Not on file   Years of education: Not on file   Highest education level: Associate degree: occupational, Scientist, product/process development, or vocational program  Occupational History   Not on file  Tobacco Use   Smoking status: Every Day    Current packs/day: 0.50    Average  packs/day: 0.5 packs/day for 6.0 years (3.0 ttl pk-yrs)    Types: Cigarettes   Smokeless tobacco: Never  Vaping Use   Vaping status: Never Used  Substance and Sexual Activity   Alcohol use: Yes    Alcohol/week: 2.0 - 6.0 standard drinks of alcohol    Types: 2 - 6 Cans of beer per week    Comment: socially   Drug use: Not Currently    Types: Marijuana   Sexual activity: Not on file  Other Topics Concern   Not on file  Social History Narrative   Not on file   Social Drivers of Health   Financial Resource Strain: Medium Risk (09/14/2023)    Overall Financial Resource Strain (CARDIA)    Difficulty of Paying Living Expenses: Somewhat hard  Food Insecurity: No Food Insecurity (09/14/2023)   Hunger Vital Sign    Worried About Running Out of Food in the Last Year: Never true    Ran Out of Food in the Last Year: Never true  Transportation Needs: No Transportation Needs (09/14/2023)   PRAPARE - Administrator, Civil Service (Medical): No    Lack of Transportation (Non-Medical): No  Physical Activity: Unknown (09/14/2023)   Exercise Vital Sign    Days of Exercise per Week: 0 days    Minutes of Exercise per Session: Not on file  Stress: Stress Concern Present (09/14/2023)   [employer] of Occupational Health - Occupational Stress Questionnaire    Feeling of Stress : Rather much  Social Connections: Socially Isolated (09/14/2023)   Social Connection and Isolation Panel [NHANES]    Frequency of Communication with Friends and Family: Never    Frequency of Social Gatherings with Friends and Family: Never    Attends Religious Services: Never    Database administrator or Organizations: No    Attends Engineer, structural: Not on file    Marital Status: Never married  Intimate Partner Violence: Not on file    Review of Systems  Constitutional:  Negative for fever.  Eyes:  Negative for blurred vision.  Respiratory:  Negative for shortness of breath.   Cardiovascular:  Negative for chest pain.  Gastrointestinal:  Positive for nausea and vomiting. Negative for abdominal pain, blood in stool, constipation and diarrhea.  Neurological:  Negative for headaches.        Objective    BP (!) 146/94   Pulse (!) 104   Temp 98.1 F (36.7 C) (Temporal)   Ht 5\' 10"  (1.778 m)   Wt 245 lb (111.1 kg)   SpO2 94%   BMI 35.15 kg/m   Physical Exam Vitals reviewed.  Constitutional:      Appearance: Normal appearance.  HENT:     Head: Normocephalic and atraumatic.  Cardiovascular:     Rate and Rhythm: Normal  rate and regular rhythm.  Pulmonary:     Effort: Pulmonary effort is normal.     Breath sounds: Normal breath sounds.  Musculoskeletal:     Cervical back: Neck supple.  Skin:    General: Skin is warm and dry.  Neurological:     Mental Status: He is alert and oriented to person, place, and time.  Psychiatric:        Mood and Affect: Mood normal.        Behavior: Behavior normal.        Thought Content: Thought content normal.        Judgment: Judgment normal.  Assessment & Plan:   Problem List Items Addressed This Visit       Cardiovascular and Mediastinum   Hypertension   Chronic, uncontrolled Start amlodipine 5 mg daily Discussed potential side effects and what to do if these were to occur Also discussed referral back to cardiology considering his history of MI, patient declined referral for today. No current symptoms of CAD, will focus on hypertension and risk reduction.  Labs ordered for further evaluation, further recommendations may be made based upon these results.      Relevant Medications   amLODipine (NORVASC) 5 MG tablet   Other Relevant Orders   CBC   Comprehensive metabolic panel   Hemoglobin A1c   Lipid panel   TSH     Other   ADHD   Chronic, stable, not currently on medication We discussed that we would need to improve blood pressure control prior to restarting stimulant.  Patient is agreeable to this.  Also discussed referral to psychiatry if he needs to restart stimulant, he is also agreeable to this if needed in the future.      Class 2 severe obesity with serious comorbidity and body mass index (BMI) of 35.0 to 35.9 in adult [facility]) - Primary   Labs ordered, further recommendations may be made based upon his results       Relevant Medications   amLODipine (NORVASC) 5 MG tablet   Other Relevant Orders   CBC   Comprehensive metabolic panel   Hemoglobin A1c   Lipid panel   TSH   Encounter for hepatitis C screening test for low risk  patient   Labs ordered, further recommendations may be made based upon his results       Relevant Orders   Hepatitis C antibody    Return in about 1 month (around 10/12/2023) for F/U with Maralyn Sago, CPE with Maralyn Sago.   Elenore Paddy, NP

## 2023-09-14 NOTE — Assessment & Plan Note (Signed)
 Chronic, stable, not currently on medication We discussed that we would need to improve blood pressure control prior to restarting stimulant.  Patient is agreeable to this.  Also discussed referral to psychiatry if he needs to restart stimulant, he is also agreeable to this if needed in the future.

## 2023-09-14 NOTE — Assessment & Plan Note (Signed)
 Chronic, uncontrolled Start amlodipine 5 mg daily Discussed potential side effects and what to do if these were to occur Also discussed referral back to cardiology considering his history of MI, patient declined referral for today. No current symptoms of CAD, will focus on hypertension and risk reduction.  Labs ordered for further evaluation, further recommendations may be made based upon these results.

## 2023-09-14 NOTE — Telephone Encounter (Unsigned)
 Copied from CRM [id number]. Topic: Clinical - Prescription Issue >> Sep 14, 2023  2:36 PM Orinda Kenner C wrote: Reason for CRM: Patient [phone] has a new insurance through [employer] will not cover medication, patient needs amLODipine (NORVASC) 5 MG tablet sent to [id number] - [place], [place] - [address] AT [facility] OF [address] [place] [address] [phone]

## 2023-09-15 LAB — HEPATITIS C ANTIBODY: Hepatitis C Ab: NONREACTIVE

## 2023-09-15 MED ORDER — AMLODIPINE BESYLATE 5 MG PO TABS
5.0000 mg | ORAL_TABLET | Freq: Every day | ORAL | 0 refills | Status: DC
Start: 2023-09-15 — End: 2023-11-02

## 2023-09-16 ENCOUNTER — Emergency Department (HOSPITAL_COMMUNITY): Payer: 59

## 2023-09-16 ENCOUNTER — Emergency Department (HOSPITAL_COMMUNITY)
Admission: EM | Admit: 2023-09-16 | Discharge: 2023-09-17 | Disposition: A | Discharge status: Home / Self Care | Payer: 59 | Attending: Emergency Medicine | Admitting: Emergency Medicine

## 2023-09-16 ENCOUNTER — Encounter (HOSPITAL_COMMUNITY): Payer: Self-pay

## 2023-09-16 ENCOUNTER — Other Ambulatory Visit: Payer: Self-pay

## 2023-09-16 DIAGNOSIS — R0789 Other chest pain: Secondary | ICD-10-CM | POA: Insufficient documentation

## 2023-09-16 DIAGNOSIS — F419 Anxiety disorder, unspecified: Secondary | ICD-10-CM | POA: Insufficient documentation

## 2023-09-16 DIAGNOSIS — R079 Chest pain, unspecified: Secondary | ICD-10-CM

## 2023-09-16 LAB — CBC
HCT: 44.2 % (ref 39.0–52.0)
Hemoglobin: 16.3 g/dL (ref 13.0–17.0)
MCH: 31.7 pg (ref 26.0–34.0)
MCHC: 36.9 g/dL — ABNORMAL HIGH (ref 30.0–36.0)
MCV: 85.8 fL (ref 80.0–100.0)
Platelets: 209 10*3/uL (ref 150–400)
RBC: 5.15 MIL/uL (ref 4.22–5.81)
RDW: 11.9 % (ref 11.5–15.5)
WBC: 11.2 10*3/uL — ABNORMAL HIGH (ref 4.0–10.5)
nRBC: 0 % (ref 0.0–0.2)

## 2023-09-16 LAB — BASIC METABOLIC PANEL
Anion gap: 14 (ref 5–15)
BUN: 7 mg/dL (ref 6–20)
CO2: 25 mmol/L (ref 22–32)
Calcium: 9.1 mg/dL (ref 8.9–10.3)
Chloride: 98 mmol/L (ref 98–111)
Creatinine, Ser: 0.84 mg/dL (ref 0.61–1.24)
GFR, Estimated: >60 mL/min (ref >60)
Glucose, Bld: 91 mg/dL (ref 70–99)
Potassium: 3.8 mmol/L (ref 3.5–5.1)
Sodium: 137 mmol/L (ref 135–145)

## 2023-09-16 LAB — TROPONIN I (HIGH SENSITIVITY)
Troponin I (High Sensitivity): <2 ng/L (ref <18)
Troponin I (High Sensitivity): <2 ng/L (ref <18)

## 2023-09-16 NOTE — ED Triage Notes (Signed)
 Pt c/o non radiating left sided chest pain and SOB started at 0500 today. Pt able to speak in complete sentences.

## 2023-09-17 LAB — D-DIMER, QUANTITATIVE: D-Dimer, Quant: 0.27 ug{FEU}/mL (ref 0.00–0.50)

## 2023-09-17 MED ORDER — KETOROLAC TROMETHAMINE 30 MG/ML IJ SOLN
15.0000 mg | Freq: Once | INTRAMUSCULAR | Status: AC
  Administered 2023-09-17: 15 mg via INTRAVENOUS
  Filled 2023-09-17: qty 1

## 2023-09-17 MED ORDER — ALUM & MAG HYDROXIDE-SIMETH 200-200-20 MG/5ML PO SUSP
30.0000 mL | Freq: Once | ORAL | Status: AC
  Administered 2023-09-17: 30 mL via ORAL
  Filled 2023-09-17: qty 30

## 2023-09-17 NOTE — ED Provider Notes (Signed)
 [facility]ERGENC[facility] DEPARTMENT AT [facility] Provider Note   CSN: [id number] Arrival date & time: 09/16/23  1722     History  Chief Complaint  Patient presents with   Chest Pain    Joel Sandoval is a 38 y.o. male.  Patient presents to the emergency department for evaluation of chest pain.  Patient reports that he had onset of a dull pain in the left chest that does not radiate around 5 PM today.  He reports that it does feel like it is making him short of breath. Patient concerned because he has had a heart attack in the past.       Home Medications Prior to Admission medications   Medication Sig Start Date End Date Taking? Authorizing Provider  amLODipine (NORVASC) 5 MG tablet Take 1 tablet (5 mg total) by mouth daily. 09/15/23   Elenore Paddy, NP  fluticasone (FLONASE) 50 MCG/ACT nasal spray Place 1 spray into both nostrils daily. 04/10/18 07/05/20  Georgetta Haber, NP  omeprazole (PRILOSEC) 20 MG capsule Take 1 pill twice a day before meal.  After 2 weeks go down to 1 pill a day.  After this take only as needed. 03/23/19 07/05/20  Eustace Moore, MD      Allergies    No known allergies    Review of Systems   Review of Systems  Physical Exam Updated Vital Signs BP (!) 161/89   Pulse 81   Temp 98 F (36.7 C)   Resp 15   Ht 5\' 10"  (1.778 m)   Wt 111.1 kg   SpO2 96%   BMI 35.14 kg/m  Physical Exam Vitals and nursing note reviewed.  Constitutional:      General: He is not in acute distress.    Appearance: He is well-developed.  HENT:     Head: Normocephalic and atraumatic.     Mouth/Throat:     Mouth: Mucous membranes are moist.  Eyes:     General: Vision grossly intact. Gaze aligned appropriately.     Extraocular Movements: Extraocular movements intact.     Conjunctiva/sclera: Conjunctivae normal.  Cardiovascular:     Rate and Rhythm: Normal rate and regular rhythm.     Pulses: Normal pulses.     Heart sounds: Normal heart sounds, S1 normal  and S2 normal. No murmur heard.    No friction rub. No gallop.  Pulmonary:     Effort: Pulmonary effort is normal. No respiratory distress.     Breath sounds: Normal breath sounds.  Abdominal:     Palpations: Abdomen is soft.     Tenderness: There is no abdominal tenderness. There is no guarding or rebound.     Hernia: No hernia is present.  Musculoskeletal:        General: No swelling.     Cervical back: Full passive range of motion without pain, normal range of motion and neck supple. No pain with movement, spinous process tenderness or muscular tenderness. Normal range of motion.     Right lower leg: No edema.     Left lower leg: No edema.  Skin:    General: Skin is warm and dry.     Capillary Refill: Capillary refill takes less than 2 seconds.     Findings: No ecchymosis, erythema, lesion or wound.  Neurological:     Mental Status: He is alert and oriented to person, place, and time.     GCS: GCS eye subscore is 4. GCS verbal subscore  is 5. GCS motor subscore is 6.     Cranial Nerves: Cranial nerves 2-12 are intact.     Sensory: Sensation is intact.     Motor: Motor function is intact. No weakness or abnormal muscle tone.     Coordination: Coordination is intact.  Psychiatric:        Mood and Affect: Mood is anxious.        Speech: Speech normal.        Behavior: Behavior normal.     ED Results / Procedures / Treatments   Labs (all labs ordered are listed, but only abnormal results are displayed) Labs Reviewed  CBC - Abnormal; Notable for the following components:      Result Value   WBC 11.2 (*)    MCHC 36.9 (*)    All other components within normal limits  BASIC METABOLIC PANEL  D-DIMER, QUANTITATIVE  TROPONIN I (HIGH SENSITIVITY)  TROPONIN I (HIGH SENSITIVITY)    EKG EKG Interpretation Date/Time:  Friday September 16 2023 17:29:27 EST Ventricular Rate:  97 PR Interval:  136 QRS Duration:  86 QT Interval:  342 QTC Calculation: 434 R Axis:   57  Text  Interpretation: Normal sinus rhythm Normal ECG Confirmed by Gilda Crease [phone]) on 09/16/2023 10:57:58 PM  Radiology DG Chest 2 View Result Date: 09/16/2023 CLINICAL DATA:  Chest pain EXAM: CHEST - 2 VIEW COMPARISON:  04/10/2018 FINDINGS: The heart size and mediastinal contours are within normal limits. Both lungs are clear. The visualized skeletal structures are unremarkable. IMPRESSION: Normal chest radiographs. Electronically Signed   By: Duanne Guess D.O.   On: 09/16/2023 18:25    Procedures Procedures    Medications Ordered in ED Medications  ketorolac (TORADOL) 30 MG/ML injection 15 mg (has no administration in time range)  alum & mag hydroxide-simeth (MAALOX/MYLANTA) 200-200-20 MG/5ML suspension 30 mL (has no administration in time range)    ED Course/ Medical Decision Making/ A&P                                 Medical Decision Making Amount and/or Complexity of Data Reviewed Labs: ordered. Radiology: ordered.   Differential Diagnosis considered includes, but not limited to: STEMI; NSTEMI; myocarditis; pericarditis; pulmonary embolism; aortic dissection; pneumothorax; pneumonia; gastritis; musculoskeletal pain  Presents to the emergency department for evaluation of left chest pain.  Symptoms began at 5 PM today.  Patient reports prior MI.  Reviewing records reveals a history of ST elevation MI in 2015 that was secondary to coronary vasospasm.  He did not have any blockages at that time.  Patient has a normal EKG.  Troponin is negative x 2.  Pain felt to be noncardiac in origin based on presentation and workup.  He was mildly tachycardic on arrival and endorses some shortness of breath but there is no hypoxia.  A D-dimer was negative.  He is felt to be low risk by Wells criteria for PE, no further workup necessary.        Final Clinical Impression(s) / ED Diagnoses Final diagnoses:  Chest pain, unspecified type    Rx / DC Orders ED Discharge Orders      None         Pinchas Reither, Canary Brim, MD 09/17/23 0010

## 2023-10-12 ENCOUNTER — Encounter: Payer: 59 | Admitting: Nurse Practitioner

## 2023-10-19 ENCOUNTER — Encounter: Admitting: Nurse Practitioner

## 2023-10-24 ENCOUNTER — Encounter (HOSPITAL_COMMUNITY): Payer: Self-pay

## 2023-10-24 ENCOUNTER — Emergency Department (HOSPITAL_COMMUNITY)
Admission: EM | Admit: 2023-10-24 | Discharge: 2023-10-25 | Disposition: A | Discharge status: Home / Self Care | Attending: Emergency Medicine | Admitting: Emergency Medicine

## 2023-10-24 ENCOUNTER — Other Ambulatory Visit: Payer: Self-pay

## 2023-10-24 ENCOUNTER — Ambulatory Visit (HOSPITAL_COMMUNITY): Admission: EM | Admit: 2023-10-24 | Discharge: 2023-10-24 | Disposition: A | Discharge status: Home / Self Care

## 2023-10-24 DIAGNOSIS — F17200 Nicotine dependence, unspecified, uncomplicated: Secondary | ICD-10-CM | POA: Insufficient documentation

## 2023-10-24 DIAGNOSIS — R1084 Generalized abdominal pain: Secondary | ICD-10-CM

## 2023-10-24 DIAGNOSIS — I10 Essential (primary) hypertension: Secondary | ICD-10-CM | POA: Diagnosis not present

## 2023-10-24 DIAGNOSIS — Z79899 Other long term (current) drug therapy: Secondary | ICD-10-CM | POA: Diagnosis not present

## 2023-10-24 DIAGNOSIS — F1090 Alcohol use, unspecified, uncomplicated: Secondary | ICD-10-CM

## 2023-10-24 DIAGNOSIS — K2920 Alcoholic gastritis without bleeding: Secondary | ICD-10-CM | POA: Insufficient documentation

## 2023-10-24 DIAGNOSIS — K921 Melena: Secondary | ICD-10-CM

## 2023-10-24 DIAGNOSIS — R109 Unspecified abdominal pain: Secondary | ICD-10-CM | POA: Diagnosis present

## 2023-10-24 LAB — CBC
HCT: 41.7 % (ref 39.0–52.0)
Hemoglobin: 14.9 g/dL (ref 13.0–17.0)
MCH: 32.3 pg (ref 26.0–34.0)
MCHC: 35.7 g/dL (ref 30.0–36.0)
MCV: 90.3 fL (ref 80.0–100.0)
Platelets: 149 10*3/uL — ABNORMAL LOW (ref 150–400)
RBC: 4.62 MIL/uL (ref 4.22–5.81)
RDW: 13.4 % (ref 11.5–15.5)
WBC: 9 10*3/uL (ref 4.0–10.5)
nRBC: 0 % (ref 0.0–0.2)

## 2023-10-24 LAB — TYPE AND SCREEN
ABO/RH(D): O POS
Antibody Screen: NEGATIVE

## 2023-10-24 LAB — COMPREHENSIVE METABOLIC PANEL WITH GFR
ALT: 41 U/L (ref 0–44)
AST: 51 U/L — ABNORMAL HIGH (ref 15–41)
Albumin: 4.7 g/dL (ref 3.5–5.0)
Alkaline Phosphatase: 25 U/L — ABNORMAL LOW (ref 38–126)
Anion gap: 8 (ref 5–15)
BUN: 10 mg/dL (ref 6–20)
CO2: 26 mmol/L (ref 22–32)
Calcium: 9.2 mg/dL (ref 8.9–10.3)
Chloride: 102 mmol/L (ref 98–111)
Creatinine, Ser: 0.74 mg/dL (ref 0.61–1.24)
GFR, Estimated: >60 mL/min (ref >60)
Glucose, Bld: 110 mg/dL — ABNORMAL HIGH (ref 70–99)
Potassium: 4.2 mmol/L (ref 3.5–5.1)
Sodium: 136 mmol/L (ref 135–145)
Total Bilirubin: 0.8 mg/dL (ref 0.0–1.2)
Total Protein: 7.9 g/dL (ref 6.5–8.1)

## 2023-10-24 LAB — LIPASE, BLOOD: Lipase: 40 U/L (ref 11–51)

## 2023-10-24 NOTE — ED Provider Notes (Signed)
 Patient presents with abdominal pain and diarrhea x 3 to 4 days.  Patient states that he has an alcohol problem and a few days ago drank more than he normally would and began to have severe abdominal pain and black-colored diarrhea.  Patient also endorses some mild nausea and vomiting over the last few days.  Patient states that he was sober for about a month and then began to drink again this week.  Patient reports he has been taking Pepto-Bismol and ibuprofen without relief.  Upon assessment patient is well-appearing and vitals are stable.  Tender by palpation to her generalized abdomen.  Recommended that patient be seen in the emergency department to rule out gastrointestinal bleeding due to excessive alcohol use.  Patient agreeable to plan at this time.  Patient is stable to arrived to the ER via POV.   Wynonia Lawman A, NP 10/24/23 1905

## 2023-10-24 NOTE — ED Triage Notes (Signed)
 Patient states, "I have a drinking problem.  My stomach has been hurting, I am having diarrhea and the stools are black." Patient also reports that he has had N/V at times.  Patient states he has been taking a small amount Pepto Bismol and Ibuprofen.

## 2023-10-24 NOTE — ED Notes (Signed)
 Patient is being discharged from the Urgent Care and sent to the Emergency Department via POV . Per Wynonia Lawman, NP, patient is in need of higher level of care due to abdominal pain. Patient is aware and verbalizes understanding of plan of care.  Vitals:   10/24/23 1836  BP: 128/86  Pulse: 70  Resp: 16  Temp: 97.8 F (36.6 C)  SpO2: 97%

## 2023-10-24 NOTE — ED Triage Notes (Signed)
 Pt presents with abd pain, black stools, and decreased appetite that started after binge drinking 4/2-4/3. He has not had any ETOH since. Pt has taken Pepto Bismol and ibuprofen for the symptoms. Pt was seen at [facility] today for the same and referred to the ED.

## 2023-10-24 NOTE — Discharge Instructions (Signed)
Go to the ER for further evaluation of your symptoms 

## 2023-10-25 ENCOUNTER — Encounter (HOSPITAL_COMMUNITY): Payer: Self-pay

## 2023-10-25 ENCOUNTER — Emergency Department (HOSPITAL_COMMUNITY)

## 2023-10-25 LAB — POC OCCULT BLOOD, ED: Fecal Occult Bld: NEGATIVE

## 2023-10-25 MED ORDER — IOHEXOL 300 MG/ML  SOLN
100.0000 mL | Freq: Once | INTRAMUSCULAR | Status: AC | PRN
  Administered 2023-10-25: 100 mL via INTRAVENOUS

## 2023-10-25 MED ORDER — PANTOPRAZOLE SODIUM 40 MG IV SOLR
40.0000 mg | Freq: Once | INTRAVENOUS | Status: AC
  Administered 2023-10-25: 40 mg via INTRAVENOUS
  Filled 2023-10-25: qty 10

## 2023-10-25 MED ORDER — PANTOPRAZOLE SODIUM 20 MG PO TBEC: 20.0000 mg | DELAYED_RELEASE_TABLET | Freq: Every day | ORAL | 0 refills | Status: AC

## 2023-10-25 MED ORDER — SUCRALFATE 1 G PO TABS
1.0000 g | ORAL_TABLET | Freq: Once | ORAL | Status: AC
  Administered 2023-10-25: 1 g via ORAL
  Filled 2023-10-25: qty 1

## 2023-10-25 MED ORDER — SUCRALFATE 1 G PO TABS: 1.0000 g | ORAL_TABLET | Freq: Three times a day (TID) | ORAL | 0 refills | Status: AC

## 2023-10-25 MED ORDER — ONDANSETRON HCL 4 MG PO TABS: 4.0000 mg | ORAL_TABLET | Freq: Four times a day (QID) | ORAL | 0 refills | Status: AC | PRN

## 2023-10-25 NOTE — Discharge Instructions (Addendum)
 It was a pleasure taking part in your care.  As discussed, your workup is reassuring.  I believe that you have alcoholic gastritis based on examination and laboratory findings.  I am sending you home with Protonix which you should take once a day.  Please also take Carafate 3 times a day with meals.  Take Zofran every 6 hours as needed for nausea and vomiting.  Follow-up with your PCP.  Return to the ED with any new or worsening symptoms.

## 2023-10-25 NOTE — ED Provider Notes (Signed)
 Received signout from previous provider, please see his note for complete H&P.  38 year old male significant history of alcohol abuse tobacco abuse marijuana abuse who presents with complaint of abdominal pain and noticing dark stool ongoing for the past several days.  He also admits to heavy alcohol usage.  Workup in the ER overall reassuring.  He has fecal occult negative stool test, labs overall reassuring, and a CT scan of his abdomen pelvis was obtained showing no acute finding.  At this time I recommend alcohol cessation, will discharge home with Carafate and Protonix and will give GI outpatient referral.  Patient voiced understanding and agrees with plan.  BP (!) 146/82   Pulse 74   Temp 98.6 F (37 C) (Oral)   Resp 18   Ht 5\' 10"  (1.778 m)   Wt 111.1 kg   SpO2 100%   BMI 35.15 kg/m   Results for orders placed or performed during the hospital encounter of 10/24/23  Comprehensive metabolic panel   Collection Time: 10/24/23  7:49 PM  Result Value Ref Range   Sodium 136 135 - 145 mmol/L   Potassium 4.2 3.5 - 5.1 mmol/L   Chloride 102 98 - 111 mmol/L   CO2 26 22 - 32 mmol/L   Glucose, Bld 110 (H) 70 - 99 mg/dL   BUN 10 6 - 20 mg/dL   Creatinine, Ser 4.78 0.61 - 1.24 mg/dL   Calcium 9.2 8.9 - 29.5 mg/dL   Total Protein 7.9 6.5 - 8.1 g/dL   Albumin 4.7 3.5 - 5.0 g/dL   AST 51 (H) 15 - 41 U/L   ALT 41 0 - 44 U/L   Alkaline Phosphatase 25 (L) 38 - 126 U/L   Total Bilirubin 0.8 0.0 - 1.2 mg/dL   GFR, Estimated >62 >13 mL/min   Anion gap 8 5 - 15  CBC   Collection Time: 10/24/23  7:49 PM  Result Value Ref Range   WBC 9.0 4.0 - 10.5 K/uL   RBC 4.62 4.22 - 5.81 MIL/uL   Hemoglobin 14.9 13.0 - 17.0 g/dL   HCT 08.6 57.8 - 46.9 %   MCV 90.3 80.0 - 100.0 fL   MCH 32.3 26.0 - 34.0 pg   MCHC 35.7 30.0 - 36.0 g/dL   RDW 62.9 52.8 - 41.3 %   Platelets 149 (L) 150 - 400 K/uL   nRBC 0.0 0.0 - 0.2 %  Lipase, blood   Collection Time: 10/24/23  7:49 PM  Result Value Ref Range    Lipase 40 11 - 51 U/L  Type and screen [facility]   Collection Time: 10/24/23  7:49 PM  Result Value Ref Range   ABO/RH(D) O POS    Antibody Screen NEG    Sample Expiration      10/27/2023,2359 Performed at [facility], [address] [place], [place] [address]   POC occult blood, ED   Collection Time: 10/25/23  3:18 AM  Result Value Ref Range   Fecal Occult Bld NEGATIVE NEGATIVE   CT ABDOMEN PELVIS W CONTRAST Result Date: 10/25/2023 CLINICAL DATA:  Abdominal pain.  Decreased appetite.  Black stools. EXAM: CT ABDOMEN AND PELVIS WITH CONTRAST TECHNIQUE: Multidetector CT imaging of the abdomen and pelvis was performed using the standard protocol following bolus administration of intravenous contrast. RADIATION DOSE REDUCTION: This exam was performed according to the departmental dose-optimization program which includes automated exposure control, adjustment of the mA and/or kV according to patient size and/or use of  iterative reconstruction technique. CONTRAST:  100mL OMNIPAQUE IOHEXOL 300 MG/ML  SOLN COMPARISON:  None Available. FINDINGS: Lower chest: No acute abnormality. Hepatobiliary: Hepatic steatosis. No focal liver abnormality. Gallbladder appears normal. No bile duct dilatation. Pancreas: Unremarkable. No pancreatic ductal dilatation or surrounding inflammatory changes. Spleen: Normal in size without focal abnormality. Adrenals/Urinary Tract: Normal adrenal glands. No nephrolithiasis, hydronephrosis or mass. Urinary bladder is unremarkable. Stomach/Bowel: Stomach appears nondistended. High density material is noted within the gastric fundus measuring 284 Hounsfield units. Stomach appears nondistended without wall thickening or surrounding inflammation. No pathologic dilatation of the large or small bowel loops. The appendix is not visualized compatible with prior appendectomy. Colonic diverticula identified without signs of acute diverticulitis is. No  bowel wall thickening or inflammation. Vascular/Lymphatic: Normal appearance of the abdominal aorta. Abdominal vascularity appears patent. No signs of abdominopelvic adenopathy. Reproductive: Prostate is unremarkable. Other: No abdominal wall hernia or abnormality. No abdominopelvic ascites. No signs of pneumoperitoneum. Musculoskeletal: No acute or significant osseous findings. IMPRESSION: 1. No acute findings within the abdomen or pelvis. 2. Hyperdense material noted within the gastric fundus. This is nonspecific and may be seen with ingested contrast material or other substances such as Pepto-Bismol. A bleeding gastric ulcer could conceivably also have this appearance but there are no secondary signs gastritis noted. Clinical correlation advised. 3. Hepatic steatosis. 4. Colonic diverticulosis without signs of acute diverticulitis. Electronically Signed   By: Kimberley Penman M.D.   On: 10/25/2023 06:43      Debbra Fairy, PA-C 10/25/23 1034    Long, Shereen Dike, MD 10/29/23 [phone]

## 2023-10-25 NOTE — ED Provider Notes (Signed)
 [place] EMERGENCY DEPARTMENT AT [facility] Provider Note   CSN: [id number] Arrival date & time: 10/24/23  1942     History  Chief Complaint  Patient presents with   Abdominal Pain    Joel Sandoval is a 38 y.o. male with medical history to include marijuana abuse, MI, tobacco abuse, hypertension.  Patient presents to ED for evaluation of abdominal pain, black stool.  States that on 4/4 he had a very heavy binge drinking episode.  Reports he drank vodka and liquor.  Reports history of alcohol abuse, history of alcohol withdrawal seizures.  States that the next morning he woke up with abdominal pain, cramping.  States he then had 1 episode of dark tarry stool.  Reports that he has been taking Pepto-Bismol but states the dark tarry stool occurred before beginning Pepto-Bismol.  Reports he has had pain in his abdomen, nausea ever since binge drinking event.  States he was urgent care today who advised him to come to ED for further workup and management.  Denies chest pain, shortness of breath, lightheadedness, dizziness or weakness.  Endorsing nausea without vomiting.  States that he will have diarrhea as well as dark tarry stools.  Denies any pain or pressure in his rectum.   Abdominal Pain Associated symptoms: diarrhea, nausea and vomiting   Associated symptoms: no fever        Home Medications Prior to Admission medications   Medication Sig Start Date End Date Taking? Authorizing Provider  amLODipine (NORVASC) 5 MG tablet Take 1 tablet (5 mg total) by mouth daily. 09/15/23  Yes Elenore Paddy, NP  ondansetron (ZOFRAN) 4 MG tablet Take 1 tablet (4 mg total) by mouth every 6 (six) hours as needed for nausea or vomiting. 10/25/23  Yes Al Decant, PA-C  pantoprazole (PROTONIX) 20 MG tablet Take 1 tablet (20 mg total) by mouth daily. 10/25/23  Yes Al Decant, PA-C  sucralfate (CARAFATE) 1 g tablet Take 1 tablet (1 g total) by mouth 4 (four) times daily -  with meals  and at bedtime. 10/25/23  Yes Al Decant, PA-C  amphetamine-dextroamphetamine (ADDERALL XR) 15 MG 24 hr capsule Take 15 mg by mouth every morning. Patient not taking: Reported on 10/25/2023 04/09/22   [provider]  fluticasone (FLONASE) 50 MCG/ACT nasal spray Place 1 spray into both nostrils daily. 04/10/18 07/05/20  Georgetta Haber, NP  omeprazole (PRILOSEC) 20 MG capsule Take 1 pill twice a day before meal.  After 2 weeks go down to 1 pill a day.  After this take only as needed. 03/23/19 07/05/20  Eustace Moore, MD      Allergies    No known allergies    Review of Systems   Review of Systems  Constitutional:  Negative for fever.  Gastrointestinal:  Positive for abdominal pain, blood in stool, diarrhea, nausea and vomiting.  All other systems reviewed and are negative.   Physical Exam Updated Vital Signs BP 136/88 (BP Location: Left Arm)   Pulse 72   Temp 98.6 F (37 C) (Oral)   Resp 18   Ht 5\' 10"  (1.778 m)   Wt 111.1 kg   SpO2 100%   BMI 35.15 kg/m  Physical Exam Vitals and nursing note reviewed.  Constitutional:      General: He is not in acute distress.    Appearance: He is well-developed.  HENT:     Head: Normocephalic and atraumatic.  Eyes:     Conjunctiva/sclera: Conjunctivae  normal.  Cardiovascular:     Rate and Rhythm: Normal rate and regular rhythm.     Heart sounds: No murmur heard. Pulmonary:     Effort: Pulmonary effort is normal. No respiratory distress.     Breath sounds: Normal breath sounds.  Abdominal:     Palpations: Abdomen is soft.     Tenderness: There is abdominal tenderness.     Comments: Epigastric TTP without overlying skin change, no rebound or guarding  Musculoskeletal:        General: No swelling.     Cervical back: Neck supple.  Skin:    General: Skin is warm and dry.     Capillary Refill: Capillary refill takes less than 2 seconds.  Neurological:     Mental Status: He is alert and oriented to person, place, and  time.  Psychiatric:        Mood and Affect: Mood normal.     ED Results / Procedures / Treatments   Labs (all labs ordered are listed, but only abnormal results are displayed) Labs Reviewed  COMPREHENSIVE METABOLIC PANEL WITH GFR - Abnormal; Notable for the following components:      Result Value   Glucose, Bld 110 (*)    AST 51 (*)    Alkaline Phosphatase 25 (*)    All other components within normal limits  CBC - Abnormal; Notable for the following components:   Platelets 149 (*)    All other components within normal limits  LIPASE, BLOOD  POC OCCULT BLOOD, ED  TYPE AND SCREEN    EKG None  Radiology No results found.  Procedures Procedures   Medications Ordered in ED Medications  pantoprazole (PROTONIX) injection 40 mg (40 mg Intravenous Given 10/25/23 0426)  sucralfate (CARAFATE) tablet 1 g (1 g Oral Given 10/25/23 0426)  iohexol (OMNIPAQUE) 300 MG/ML solution 100 mL (100 mLs Intravenous Contrast Given 10/25/23 0500)    ED Course/ Medical Decision Making/ A&P  Medical Decision Making Amount and/or Complexity of Data Reviewed Labs: ordered. Radiology: ordered.  Risk Prescription drug management.   38 year old who presents for evaluation.  Please see HPI for further details.  On examination the patient is afebrile and nontachycardic.  His lung sounds are clear bilaterally, he is not hypoxic.  He has tenderness in his epigastric region without rebound or guarding, no overlying skin change.  Neurological examinations are baseline.  Will assess patient for GI bleed.  Will collect CBC, CMP, lipase, occult card, will scan CT abdomen/pelvis.  Patient provided with Carafate, Protonix.  Patient CBC without leukocytosis or anemia.  Lipase 40.  Metabolic panel without electrolyte derangement, AST 51, alk phos 25, anion gap 8.  Fecal occult cards negative.  After Protonix and Carafate, patient reports that his abdominal pain has decreased.  Suspect gastritis in this patient.   Especially in the absence of blood in stool.  Will assess further with CT scan and determine disposition.  At end of shift, CT scan has not resulted. Signed out to Fayrene Helper PA-C pending imaging. Plan of management discussed.    Final Clinical Impression(s) / ED Diagnoses Final diagnoses:  Acute alcoholic gastritis without hemorrhage    Rx / DC Orders ED Discharge Orders          Ordered    pantoprazole (PROTONIX) 20 MG tablet  Daily        10/25/23 0604    sucralfate (CARAFATE) 1 g tablet  3 times daily with meals & bedtime  10/25/23 0604    ondansetron (ZOFRAN) 4 MG tablet  Every 6 hours PRN        10/25/23 0604              Al Decant, PA-C 10/25/23 1610    Nira Conn, MD 10/26/23 2015

## 2023-11-02 ENCOUNTER — Ambulatory Visit: Admitting: Nurse Practitioner

## 2023-11-02 VITALS — BP 116/80 | HR 99 | Temp 98.2°F | Ht 70.0 in | Wt 245.4 lb

## 2023-11-02 DIAGNOSIS — F1090 Alcohol use, unspecified, uncomplicated: Secondary | ICD-10-CM | POA: Diagnosis not present

## 2023-11-02 DIAGNOSIS — R4589 Other symptoms and signs involving emotional state: Secondary | ICD-10-CM | POA: Diagnosis not present

## 2023-11-02 DIAGNOSIS — Z0001 Encounter for general adult medical examination with abnormal findings: Secondary | ICD-10-CM | POA: Diagnosis not present

## 2023-11-02 DIAGNOSIS — R109 Unspecified abdominal pain: Secondary | ICD-10-CM | POA: Diagnosis not present

## 2023-11-02 DIAGNOSIS — I10 Essential (primary) hypertension: Secondary | ICD-10-CM | POA: Diagnosis not present

## 2023-11-02 MED ORDER — AMLODIPINE BESYLATE 5 MG PO TABS: 5.0000 mg | ORAL_TABLET | Freq: Every day | ORAL | 3 refills | Status: AC

## 2023-11-02 NOTE — Assessment & Plan Note (Signed)
 Chronic, intermittent Consider rechecking metabolic panel at next office visit to monitor liver enzymes. Patient encouraged to look into alcoholic Anonymous to assist with maintaining sobriety. Patient also referred to psychiatry and counseling services for assistance.

## 2023-11-02 NOTE — Assessment & Plan Note (Signed)
 Chronic Much improved with initiation of amlodipine 5 mg daily.  Patient encouraged to continue medication as prescribed.

## 2023-11-02 NOTE — Assessment & Plan Note (Signed)
 Chronic, intermittent Not actively suicidal today Referral to counseling services and psychiatry ordered today for assistance with management.

## 2023-11-02 NOTE — Assessment & Plan Note (Signed)
 Healthy lifestyle discussed, handout provided. Follow-up in 6 months.

## 2023-11-02 NOTE — Assessment & Plan Note (Addendum)
 Chronic, improved with use of PPI and Carafate.  Patient denies recurrence of black tarry stools. Referral to GI ordered today to determine if patient needs endoscopy evaluation.

## 2023-11-02 NOTE — Progress Notes (Signed)
 Complete physical exam  Patient: Joel Sandoval   DOB: [birth year]   37 y.o. Male  MRN: [id number]  Subjective:    Chief Complaint  Patient presents with   Annual Exam    Joel Sandoval is a 38 y.o. male who presents today for a complete physical exam.  Overall patient is now feeling well.  He was seen in the emergency department about 10 days ago for acute alcoholic gastritis without hemorrhage.  He also reported seeing dark, tarry stools.  However he was also taking Pepto-Bismol due to abdominal discomfort.  In the emergency department workup was negative for anemia, lipase normal, overall metabolic panel was within normal limits AST slightly elevated at 51, and fecal occult testing was negative.  CT scan negative for acute findings.  There was some hyperdense material in the gastric fundus which was thought to be either contrast material, other oral substance such as medications like Pepto-Bismol, or bleeding ulcer.  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  Patient started on Protonix and Carafate and encouraged to follow-up with PCP.  Today he reports since starting Protonix and Carafate symptoms are much improved.  He has not yet been referred to gastroenterology.  He reports having stopped drinking alcohol since 10/22/2023.  He would like referral to psychiatry and counseling services to assist him with abstinence from alcohol use as well as to treat his mood.  He reports history of depressed mood with episodes of mania as well.  He is concerned he may have bipolar but has not been officially diagnosed with this.  Not suicidal currently.  Health maintenance: Tdap due in 2028, has completed hep C screening.  Most recent fall risk assessment:    11/02/2023    1:08 PM  Fall Risk   Falls in the past year? 0  Number falls in past yr: 0  Injury with Fall? 0  Risk for fall due to : No Fall Risks  Follow up Falls evaluation completed      Most recent depression screenings:    11/02/2023    1:08 PM 09/14/2023    1:20 PM  PHQ 2/9 Scores  PHQ - 2 Score 2 0  PHQ- 9 Score 7       Past Medical History:  Diagnosis Date   Marijuana abuse    MI (myocardial infarction) (HCC)    Tobacco abuse    Past Surgical History:  Procedure Laterality Date   APPENDECTOMY     Age 65   Social History   Socioeconomic History   Marital status: Single    Spouse name: Not on file   Number of children: Not on file   Years of education: Not on file   Highest education level: Associate degree: occupational, Scientist, product/process development, or vocational program  Occupational History   Not on file  Tobacco Use   Smoking status: Former    Current packs/day: 0.50    Average packs/day: 0.5 packs/day for 6.0 years (3.0 ttl pk-yrs)    Types: Cigarettes   Smokeless tobacco: Never  Vaping Use   Vaping status: Every Day   Substances: Nicotine  Substance and Sexual Activity   Alcohol use: Yes    Comment: heavy drinker   Drug use: Yes    Types: Marijuana    Comment: OTC CBD   Sexual activity: Not on file  Other Topics Concern   Not on file  Social History Narrative   Not on file  Social Drivers of Health   Financial Resource Strain: Medium Risk (09/14/2023)   Overall Financial Resource Strain (CARDIA)    Difficulty of Paying Living Expenses: Somewhat hard  Food Insecurity: No Food Insecurity (09/14/2023)   Hunger Vital Sign    Worried About Running Out of Food in the Last Year: Never true    Ran Out of Food in the Last Year: Never true  Transportation Needs: No Transportation Needs (09/14/2023)   PRAPARE - Administrator, Civil Service (Medical): No    Lack of Transportation (Non-Medical): No  Physical Activity: Unknown (09/14/2023)   Exercise Vital Sign    Days of Exercise per Week: 0 days    Minutes of Exercise per Session: Not on file  Stress: Stress Concern Present (09/14/2023)   [employer] of Occupational Health -  Occupational Stress Questionnaire    Feeling of Stress : Rather much  Social Connections: Socially Isolated (09/14/2023)   Social Connection and Isolation Panel [NHANES]    Frequency of Communication with Friends and Family: Never    Frequency of Social Gatherings with Friends and Family: Never    Attends Religious Services: Never    Database administrator or Organizations: No    Attends Engineer, structural: Not on file    Marital Status: Never married  Intimate Partner Violence: Not on file   Family History  Problem Relation Age of Onset   Coronary artery disease Mother    Allergies  Allergen Reactions   No Known Allergies       Patient Care Team: Zorita Hiss, NP as PCP - General (Nurse Practitioner)   Outpatient Medications Prior to Visit  Medication Sig   amphetamine-dextroamphetamine (ADDERALL XR) 15 MG 24 hr capsule Take 15 mg by mouth every morning.   ondansetron (ZOFRAN) 4 MG tablet Take 1 tablet (4 mg total) by mouth every 6 (six) hours as needed for nausea or vomiting.   pantoprazole (PROTONIX) 20 MG tablet Take 1 tablet (20 mg total) by mouth daily.   sucralfate (CARAFATE) 1 g tablet Take 1 tablet (1 g total) by mouth 4 (four) times daily -  with meals and at bedtime.   [DISCONTINUED] amLODipine (NORVASC) 5 MG tablet Take 1 tablet (5 mg total) by mouth daily.   No facility-administered medications prior to visit.    Review of Systems  Constitutional:  Negative for chills and fever.  HENT:  Negative for hearing loss and tinnitus.   Eyes:  Positive for blurred vision. Negative for double vision.  Respiratory:  Negative for shortness of breath and wheezing.   Cardiovascular:  Negative for chest pain and palpitations.  Gastrointestinal:  Positive for abdominal pain and melena. Negative for blood in stool.  Genitourinary:  Negative for dysuria and hematuria.  Skin:  Negative for itching and rash.  Neurological:  Negative for seizures and loss of  consciousness.  Psychiatric/Behavioral:  Positive for depression. Negative for suicidal ideas. The patient is nervous/anxious.           Objective:     BP 116/80   Pulse 99   Temp 98.2 F (36.8 C) (Temporal)   Ht 5\' 10"  (1.778 m)   Wt 245 lb 6 oz (111.3 kg)   SpO2 98%   BMI 35.21 kg/m  BP Readings from Last 3 Encounters:  11/02/23 116/80  10/25/23 (!) 146/82  10/24/23 128/86   Wt Readings from Last 3 Encounters:  11/02/23 245 lb 6 oz (111.3 kg)  10/24/23 245  lb (111.1 kg)  09/16/23 244 lb 14.9 oz (111.1 kg)      11/02/2023    1:08 PM 09/14/2023    1:20 PM  PHQ9 SCORE ONLY  PHQ-9 Total Score 7 0      Physical Exam Vitals reviewed.  Constitutional:      Appearance: Normal appearance.  HENT:     Head: Normocephalic and atraumatic.  Cardiovascular:     Rate and Rhythm: Normal rate and regular rhythm.  Pulmonary:     Effort: Pulmonary effort is normal.     Breath sounds: Normal breath sounds.  Musculoskeletal:     Cervical back: Neck supple.  Skin:    General: Skin is warm and dry.  Neurological:     Mental Status: He is alert and oriented to person, place, and time.  Psychiatric:        Mood and Affect: Mood normal.        Behavior: Behavior normal.        Thought Content: Thought content normal.        Judgment: Judgment normal.      No results found for any visits on 11/02/23.     Assessment & Plan:    Routine Health Maintenance and Physical Exam  Immunization History  Administered Date(s) Administered   PFIZER(Purple Top)SARS-COV-2 Vaccination 10/12/2019, 11/05/2019   Tdap 07/19/2016    Health Maintenance  Topic Date Due   HIV Screening  Never done   Pneumococcal Vaccine 74-62 Years old (1 of 2 - PCV) Never done   COVID-19 Vaccine (3 - 2024-25 season) 03/20/2023   INFLUENZA VACCINE  02/17/2024   DTaP/Tdap/Td (2 - Td or Tdap) 07/19/2026   Hepatitis C Screening  Completed   HPV VACCINES  Aged Out   Meningococcal B Vaccine  Aged Out     Discussed health benefits of physical activity, and encouraged him to engage in regular exercise appropriate for his age and condition.  Problem List Items Addressed This Visit       Cardiovascular and Mediastinum   Hypertension   Chronic Much improved with initiation of amlodipine 5 mg daily.  Patient encouraged to continue medication as prescribed.      Relevant Medications   amLODipine (NORVASC) 5 MG tablet     Other   Abdominal pain   Chronic, improved with use of PPI and Carafate.  Patient denies recurrence of black tarry stools. Referral to GI ordered today to determine if patient needs endoscopy evaluation.      Relevant Orders   Ambulatory referral to Gastroenterology   Alcohol use   Chronic, intermittent Consider rechecking metabolic panel at next office visit to monitor liver enzymes. Patient encouraged to look into alcoholic Anonymous to assist with maintaining sobriety. Patient also referred to psychiatry and counseling services for assistance.       Relevant Orders   Ambulatory referral to Psychology   Depressed mood   Chronic, intermittent Not actively suicidal today Referral to counseling services and psychiatry ordered today for assistance with management.      Relevant Orders   Ambulatory referral to Psychiatry   Encounter for general adult medical examination with abnormal findings - Primary   Healthy lifestyle discussed, handout provided. Follow-up in 6 months.      Return in about 6 months (around 05/03/2024) for F/U with Delina Kruczek.     Elenore Paddy, NP
